# Patient Record
Sex: Male | Born: 1960 | Race: White | Hispanic: No | Marital: Married | Smoking: Current every day smoker
Health system: Southern US, Community
[De-identification: ages and names within clinical notes are randomized; demographics above are authoritative.]

## PROBLEM LIST (undated history)

## (undated) DIAGNOSIS — K219 Gastro-esophageal reflux disease without esophagitis: Secondary | ICD-10-CM

## (undated) DIAGNOSIS — K760 Fatty (change of) liver, not elsewhere classified: Secondary | ICD-10-CM

## (undated) DIAGNOSIS — F329 Major depressive disorder, single episode, unspecified: Secondary | ICD-10-CM

## (undated) DIAGNOSIS — F32A Depression, unspecified: Secondary | ICD-10-CM

## (undated) DIAGNOSIS — T7840XA Allergy, unspecified, initial encounter: Secondary | ICD-10-CM

## (undated) HISTORY — DX: Allergy, unspecified, initial encounter: T78.40XA

## (undated) HISTORY — PX: VASECTOMY: SHX75

## (undated) HISTORY — PX: COLONOSCOPY: SHX174

---

## 2008-11-15 ENCOUNTER — Encounter: Payer: Self-pay | Admitting: Family Medicine

## 2009-10-02 ENCOUNTER — Ambulatory Visit: Payer: Self-pay | Admitting: Family Medicine

## 2009-10-02 DIAGNOSIS — F329 Major depressive disorder, single episode, unspecified: Secondary | ICD-10-CM

## 2009-10-02 DIAGNOSIS — R5381 Other malaise: Secondary | ICD-10-CM

## 2009-10-02 DIAGNOSIS — R5383 Other fatigue: Secondary | ICD-10-CM

## 2009-10-03 ENCOUNTER — Encounter: Payer: Self-pay | Admitting: Family Medicine

## 2009-10-03 LAB — CONVERTED CEMR LAB
AST: 28 units/L (ref 0–37)
Albumin: 4.8 g/dL (ref 3.5–5.2)
BUN: 12 mg/dL (ref 6–23)
CO2: 27 meq/L (ref 19–32)
Calcium: 10.3 mg/dL (ref 8.4–10.5)
Chloride: 101 meq/L (ref 96–112)
Cholesterol: 161 mg/dL (ref 0–200)
HDL: 46 mg/dL (ref >39)
PSA: 1.84 ng/mL (ref 0.10–4.00)
Potassium: 3.8 meq/L (ref 3.5–5.3)
TSH: 1.375 microintl units/mL (ref 0.350–4.500)
Testosterone Free: 60.8 pg/mL (ref 47.0–244.0)

## 2009-11-04 ENCOUNTER — Ambulatory Visit: Payer: Self-pay | Admitting: Family Medicine

## 2010-01-08 ENCOUNTER — Ambulatory Visit: Payer: Self-pay | Admitting: Family Medicine

## 2010-01-08 DIAGNOSIS — T148XXA Other injury of unspecified body region, initial encounter: Secondary | ICD-10-CM

## 2010-01-31 ENCOUNTER — Ambulatory Visit: Payer: Self-pay | Admitting: Family Medicine

## 2010-01-31 DIAGNOSIS — J019 Acute sinusitis, unspecified: Secondary | ICD-10-CM

## 2010-04-22 NOTE — Letter (Signed)
Summary: Health Screening/Blueprint for Wellness  Health Screening/Blueprint for Wellness   Imported By: Lanelle Bal 10/14/2009 08:47:26  _____________________________________________________________________  External Attachment:    Type:   Image     Comment:   External Document

## 2010-04-22 NOTE — Assessment & Plan Note (Signed)
Summary: Sinusitis   Vital Signs:  Patient profile:   50 year old male Height:      74.5 inches Weight:      185 pounds Temp:     98.7 degrees F oral Pulse rate:   62 / minute BP sitting:   143 / 86  (right arm) Cuff size:   large  Vitals Entered By: Avon Gully CMA, Duncan Dull) (January 31, 2010 3:45 PM) CC: sinus pressure, woke up this morning to sharp stabbing pains in left side of face, teeth hurt   Primary Care Provider:  Seymour Bars DO  CC:  sinus pressure, woke up this morning to sharp stabbing pains in left side of face, and teeth hurt.  History of Present Illness: sinus pressure, woke up this morning to sharp stabbing pains in left side of face, teeth hurt. Sxs really started about a week ago, got some better.  Taking Sudafed - helps some.  Pian on the left side.  No fever.  No ear pain.  Teeth hurt on the left and below the eye over the cheek.  Taking BC powder. No sinus surgery. No recent ABX.    Current Medications (verified): 1)  Zolpidem Tartrate 10 Mg Tabs (Zolpidem Tartrate) .Marland Kitchen.. 1 Tab By Mouth At Bedtime As Needed Sleep  Allergies (verified): No Known Drug Allergies  Comments:  Nurse/Medical Assistant: The patient's medications and allergies were reviewed with the patient and were updated in the Medication and Allergy Lists. Avon Gully CMA, Duncan Dull) (January 31, 2010 3:46 PM)  Physical Exam  General:  Well-developed,well-nourished,in no acute distress; alert,appropriate and cooperative throughout examination Head:  Normocephalic and atraumatic without obvious abnormalities. No apparent alopecia or balding. Eyes:  No corneal or conjunctival inflammation noted. EOMI. Perrla.  Ears:  External ear exam shows no significant lesions or deformities.  Otoscopic examination reveals clear canals, tympanic membranes are intact bilaterally without bulging, retraction, inflammation or discharge. Hearing is grossly normal bilaterally. Nose:  External nasal  examination shows no deformity or inflammation. Nasal mucosa are pink and moist without lesions or exudates. Mouth:  Oral mucosa and oropharynx without lesions or exudates.  Teeth in good repair. Neck:  No deformities, masses, or tenderness noted. Lungs:  Normal respiratory effort, chest expands symmetrically. Lungs are clear to auscultation, no crackles or wheezes. Heart:  Normal rate and regular rhythm. S1 and S2 normal without gallop, murmur, click, rub or other extra sounds. Skin:  no rashes.   Cervical Nodes:  No lymphadenopathy noted Psych:  Cognition and judgment appear intact. Alert and cooperative with normal attention span and concentration. No apparent delusions, illusions, hallucinations   Impression & Recommendations:  Problem # 1:  SINUSITIS - ACUTE-NOS (ICD-461.9)  His updated medication list for this problem includes:    Amoxicillin 875 Mg Tabs (Amoxicillin) .Marland Kitchen... Take 1 tablet by mouth two times a day for 10 days  Instructed on treatment. Call if symptoms persist or worsen. also gave him a sample of nasal steroid to use.  He had been using Afrin and asked him not to do this for more than 3 days at a time.  Sample of Nasonex given.  Instructed on how to use appropriately.  Call if not better in 8 to 10 days.  Complete Medication List: 1)  Zolpidem Tartrate 10 Mg Tabs (Zolpidem tartrate) .Marland Kitchen.. 1 tab by mouth at bedtime as needed sleep 2)  Amoxicillin 875 Mg Tabs (Amoxicillin) .... Take 1 tablet by mouth two times a day for 10 days  Patient  Instructions: 1)  Nasal saline rinses.   2)  Can use the nasal spray 2 sprays  in each nostril.  3)  Call if not better in 8-10 days on the antibiotic.  Prescriptions: AMOXICILLIN 875 MG TABS (AMOXICILLIN) Take 1 tablet by mouth two times a day for 10 days  #20 x 0   Entered and Authorized by:   Nani Gasser MD   Signed by:   Nani Gasser MD on 01/31/2010   Method used:   [facility] [phone]*  (retail)       [address]       [place], [place]  [address]       Ph: [phone] or [phone]       Fax: [phone]   RxID:   [id number]    Orders Added: 1)  Est. Patient Level III [36644]

## 2010-04-22 NOTE — Assessment & Plan Note (Signed)
Summary: CPE   Vital Signs:  Patient profile:   50 year old male Height:      74.5 inches Weight:      183 pounds BMI:     23.27 O2 Sat:      100 % on Room air Pulse rate:   91 / minute BP sitting:   140 / 79  (left arm) Cuff size:   large  Vitals Entered By: Payton Spark CMA (November 04, 2009 3:25 PM)  O2 Flow:  Room air CC: CPE   Primary Care Provider:  Seymour Bars DO  CC:  CPE.  History of Present Illness: Garrett Wilkinson presents for CPE.  He is doing well.  He had normal labs in July.  he stopped Zoloft after 3 days due to 'bad thoughts'.   He found out that his wife was cheating on him.  He has a good support system.  He is using Ambien at night to help him sleep.  His Tetanus vaccine is due.  He will be due for prostate cancer and colorectal cancer screening next year when he turns 50 along with a screening CXR for tobacco use.  He is eating healthy, working out more, taking a MVI daily and denies CP, DOE or leg swelling.    Current Medications (verified): 1)  Zolpidem Tartrate 10 Mg Tabs (Zolpidem Tartrate) .Marland Kitchen.. 1 Tab By Mouth At Bedtime As Needed Sleep  Allergies (verified): No Known Drug Allergies  Past History:  Past Medical History: Reviewed history from 10/02/2009 and no changes required. smoker  Past Surgical History: Reviewed history from 10/02/2009 and no changes required. vasectomy  Family History: Reviewed history from 10/02/2009 and no changes required. mother healthy father healthy brother and sister healthy  Social History: Reviewed history from 10/02/2009 and no changes required. 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  Has 2 grown daughters - 1 in [place] one in [place].  Has 1 grandson.  Has a step daughter, 28. Smokes 1/2 ppd x 30 yrs. 12 ETOH/ wk. Works out 4 x a wk.  Review of Systems  The patient denies anorexia, fever, weight loss, weight gain, vision loss, decreased hearing, hoarseness, chest pain, syncope,  dyspnea on exertion, peripheral edema, prolonged cough, headaches, hemoptysis, abdominal pain, melena, hematochezia, severe indigestion/heartburn, hematuria, incontinence, genital sores, muscle weakness, suspicious skin lesions, transient blindness, difficulty walking, depression, unusual weight change, abnormal bleeding, enlarged lymph nodes, angioedema, breast masses, and testicular masses.    Physical Exam  General:  alert, well-developed, well-nourished, and well-hydrated.   Head:  normocephalic and male-pattern balding.  healing laceration on the top of his head Eyes:  pupils equal, pupils round, and pupils reactive to light.   Ears:  EACs patent; TMs translucent and gray with good cone of light and bony landmarks.  Nose:  no nasal discharge.   Mouth:  good dentition and pharynx pink and moist.   Neck:  no masses.   Lungs:  Normal respiratory effort, chest expands symmetrically. Lungs are clear to auscultation, no crackles or wheezes. Heart:  Normal rate and regular rhythm. S1 and S2 normal without gallop, murmur, click, rub or other extra sounds. Abdomen:  Bowel sounds positive,abdomen soft and non-tender without masses, organomegaly, no AA bruits ausculated Pulses:  2+ radial and pedal pulses Extremities:  no LE edema  Skin:  hematoma L great toe just proximal to the nail bed Cervical Nodes:  No lymphadenopathy noted Psych:  good eye contact, not anxious appearing, and  not depressed appearing.     Impression & Recommendations:  Problem # 1:  HEALTH MAINTENANCE EXAM (ICD-V70.0) Keeping healthy checklist for men reviewed. BP at the ULN. BMI normal range.   Continue healthy diet, regular exercise, MVI. Work on smoking cessation. Call if wanting to add counseling referral but mood has improved/ stabilized w/o meds. Labs 09-2009 normal. Tdap today. RTC in 1 yr for next physical with CXR, EKG, DRE, colonoscopy.  Complete Medication List: 1)  Zolpidem Tartrate 10 Mg Tabs (Zolpidem  tartrate) .Marland Kitchen.. 1 tab by mouth at bedtime as needed sleep  Other Orders: Tdap => 31yrs IM (09811) Admin 1st Vaccine (91478) Admin 1st Vaccine [facility]) [phone])  Patient Instructions: 1)  Let me know if you want to add counseling for your mood. 2)  Labs looked great in July. 3)  Work on cutting back on smoking. 4)  Return for next PHYSICAL in 1 yr.   5)  Tdap today.   Tetanus/Td Vaccine    Vaccine Type: Tdap    Site: right deltoid    Dose: 0.5 ml    Route: IM    Given by: Payton Spark CMA    Exp. Date: 06/15/2011    Lot #: [id number]    VIS given: 02/08/07 version given November 04, 2009.

## 2010-04-22 NOTE — Assessment & Plan Note (Signed)
Summary: NOV depression   Vital Signs:  Patient profile:   50 year old male Height:      74.5 inches Weight:      179 pounds BMI:     22.76 O2 Sat:      97 % on Room air Pulse rate:   82 / minute BP sitting:   136 / 83  (left arm) Cuff size:   large  Vitals Entered By: Payton Spark CMA (October 02, 2009 3:43 PM)  O2 Flow:  Room air CC: New to est. CPE   Primary Care Provider:  Seymour Bars DO  CC:  New to est. CPE.  History of Present Illness: 50 yo WM presents for NOV.  He is previously healthy other than borderline HTN and borderline high chol.  He takes no meds.  Took Prozac while in the [employer] but it did not work.  He is going thru a separation from his wife and has had poor contenctraion, fatigue, depression and poor sleep.  he is drinking about 12 ETOH/ wk.  he has tried OTC pills for sleep but nothing is helping.  Denies any suicidal thoughts.  Denies any wt loss.  Has been eating healthy and exercising regularly.    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  Has 2 grown daughters - 1 in [place] and one in [place].  Has 1 grandson.  Has a step daughter, 89. Smokes 1/2 ppd x 30 yrs. 12 ETOH/ wk. Works out 4 x a wk.  Review of Systems       no fevers/sweats/weakness, unexplained wt loss/gain, no change in vision, no difficulty hearing, ringing in ears, no hay fever/+allergies, no CP/discomfort, no palpitations, no breast lump/nipple discharge, no cough/wheeze, no blood in stool,no  N/V/D, no nocturia, no leaking urine, no unusual vag bleeding, no vaginal/penile discharge, + muscle/joint pain, no rash, no new/changing mole, no HA, no memory loss, + anxiety, + sleep problem, + depression, no  unexplained lumps, no easy bruising/bleeding, no concern with sexual function   Physical Exam  General:  alert, well-developed, well-nourished, and well-hydrated.   Head:  normocephalic, atraumatic, and male-pattern balding.   Eyes:  sclera non icteric Mouth:  good dentition and pharynx pink and moist.   Neck:  no masses.   Lungs:  Normal respiratory effort, chest expands symmetrically. Lungs are clear to auscultation, no crackles or wheezes. Heart:  Normal rate and regular rhythm. S1 and S2 normal without gallop, murmur, click, rub or other extra sounds. Psych:  good eye contact, not anxious appearing, and flat affect.     Impression & Recommendations:  Problem # 1:  DEPRESSION, RECURRENT (ICD-311) Related to recent marrital separation.  PHQ 9 score of 13 c/w moderate depression.  Explained that untreated depression will cause all of his symptoms - fatigue, poor sleep, poor focus and concentration.    Agrees to trial of Sertraline 50 mg/ day.  Start with 1/2 tab daily.   Treat poor sleep with Zolpidem 10 mg at bedtime.  Call if any problems on the new medication.  RTC for a CPE in 1 month and will check labs to r/o organic causes of depression and fatigue.   His updated medication list for this problem includes:  Sertraline Hcl 50 Mg Tabs (Sertraline hcl) .Marland Kitchen... 1 tab by mouth qam  Complete Medication List: 1)  Sertraline Hcl 50 Mg Tabs (Sertraline hcl) .Marland Kitchen.. 1 tab by mouth qam 2)  Zolpidem Tartrate 10 Mg Tabs (Zolpidem tartrate) .Marland Kitchen.. 1 tab by mouth at bedtime as needed sleep  Other Orders: T-Comprehensive Metabolic Panel [id number]) T-Lipid Profile [id number]) T-TSH [phone]) T-PSA [id number]) T-Testosterone, Free and Total [phone])  Patient Instructions: 1)  Start on Sertraline daily.  CUT IN HALF FOR THE FIRST WEEK. 2)  Call if any worsening in mood. 3)  Use ZOLPIDEM as needed for sleep.  Allow 8 hrs to sleep after taking. 4)  Update labs today. 5)   Will call you w/ results tomorrow. 6)  Return in 1 month for a PHYSICAL. Prescriptions: ZOLPIDEM TARTRATE 10 MG TABS (ZOLPIDEM TARTRATE) 1 tab by mouth at bedtime as needed sleep  #30 x 1   Entered and Authorized by:   Seymour Bars DO   Signed by:   Seymour Bars DO on 10/02/2009   Method used:   Printed then faxed [facility] ...       [facility] [address] [phone]* (retail)       [address] [place], [place]  [address]       Ph: [phone] or [phone]       Fax: [phone]   RxID:   [id number] SERTRALINE HCL 50 MG TABS (SERTRALINE HCL) 1 tab by mouth qAM  #30 x 1   Entered and Authorized by:   Seymour Bars DO   Signed by:   Seymour Bars DO on 10/02/2009   Method used:   [facility]        [facility] [phone]* (retail)       [address]       [place], [place]  [address]       Ph: [phone] or [phone]       Fax: [phone]   RxID:   [id number]

## 2010-04-22 NOTE — Assessment & Plan Note (Signed)
Summary: serratus anterior injury   Vital Signs:  Patient profile:   50 year old male Height:      74.5 inches Weight:      185 pounds BMI:     23.52 O2 Sat:      97 % on Room air Pulse rate:   98 / minute BP sitting:   142 / 88  (left arm) Cuff size:   large  Vitals Entered By: Payton Spark CMA (January 08, 2010 3:00 PM)  O2 Flow:  Room air CC: Injured L shoulder while lifting weights x 2 weeks ago.    Primary Care Voncile Schwarz:  Seymour Bars DO  CC:  Injured L shoulder while lifting weights x 2 weeks ago. Marland Kitchen  History of Present Illness: 50 yo WM presents for L shoulder pain that started 2 wks ago while lifting kettlebells.  He started to feel better but did it again 2 days ago.  He's had pain during the day and at night.  Has pain when he rotates to the R side.  Has full ROM of his L shoulder.  has tingling down the L arm.  He has tried Aleve and Motrin but nothing is helping.    He had an old injury in the anterior L shoulder 5 yrs ago.  He is having some tingling into the L hand x 2 days.  Denies weakness.    Current Medications (verified): 1)  Zolpidem Tartrate 10 Mg Tabs (Zolpidem Tartrate) .Marland Kitchen.. 1 Tab By Mouth At Bedtime As Needed Sleep  Allergies (verified): No Known Drug Allergies  Past History:  Past Medical History: Reviewed history from 10/02/2009 and no changes required. smoker  Social History: Reviewed history from 10/02/2009 and no changes required. 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  Has 2 grown daughters - 1 in [place] and one in [place].  Has 1 grandson.  Has a step daughter, 4. Smokes 1/2 ppd x 30 yrs. 12 ETOH/ wk. Works out 4 x a wk.  Review of Systems      See HPI  Physical Exam  General:  alert, well-developed, well-nourished, and well-hydrated.   Msk:  tender over the medial, upper L scapular border with a mildly winged scapula while doing a wall push - up.  full glenohumeral ROM with neg hawkins sign, neg empty can test.  full C spine ROM Pulses:  2+ radial pulses Extremities:  no UE or LE edema Neurologic:  grip + 5/5 bilat, full bicepts/ tricepts strength Skin:  color normal.     Impression & Recommendations:  Problem # 1:  MUSCLE STRAIN (ICD-848.9) L serratus anterior strain vs partial tear occured while weight lifting. Will treat with RX NSAIDs, limited Vicodin and Flexeril at night. Avoid weight training overhead x 2 wks.   Call if not improving after 2 wks.  Send to sport med or PT for rehab if needed.  Trigger point injections can be done for this by sports medicine.  Complete Medication List: 1)  Zolpidem Tartrate 10 Mg Tabs (Zolpidem tartrate) .Marland Kitchen.. 1 tab by mouth at bedtime as needed sleep 2)  Cyclobenzaprine Hcl 10 Mg Tabs (Cyclobenzaprine hcl) .Marland Kitchen.. 1 tab by mouth at bedtime for shoulder strain 3)  Etodolac 400 Mg Tabs (Etodolac) .Marland Kitchen.. 1 tab by mouth two times a day with food x 10 days 4)  Hydrocodone-acetaminophen 5-325 Mg Tabs (Hydrocodone-acetaminophen) .Marland Kitchen.. 1-2 tabs by mouth at bedtime with food as needed for severe pain  Patient Instructions: 1)  For  injury to the SERRATUS ANTERIOR muscle: 2)  Treat with generic flexeril at night (muscle relaxer) 3)  Use Etodolac with breakfast and dinner for pain and inflammation (instead of aleve, ibuprofen or motrin). 4)  Use Hydrocodone at night for severe pain. 5)  Avoid overhead lifting exercise. 6)  REad thru home rehab online. 7)  If not improved in 3 wks, please call and we'll set up PT. Prescriptions: HYDROCODONE-ACETAMINOPHEN 5-325 MG TABS (HYDROCODONE-ACETAMINOPHEN) 1-2 tabs by mouth at bedtime with food as needed for severe pain  #10 x 0   Entered and Authorized by:   Seymour Bars DO   Signed by:   Seymour Bars DO on 01/08/2010   Method used:   Printed then faxed to ...       [facility] [address] [phone]* (retail)       [address] [place], [place]  [address]       Ph: [phone] or [phone]       Fax: [phone]   RxID:    [id number] ETODOLAC 400 MG TABS (ETODOLAC) 1 tab by mouth two times a day with food x 10 days  #20 x 0   Entered and Authorized by:   Seymour Bars DO   Signed by:   Seymour Bars DO on 01/08/2010   Method used:   [facility] to        [facility] [phone]* (retail)       [address] [place], [place]  [address]       Ph: [phone] or [phone]       Fax: [phone]   RxID:   [id number] CYCLOBENZAPRINE HCL 10 MG TABS (CYCLOBENZAPRINE HCL) 1 tab by mouth at bedtime for shoulder strain  #20 x 0   Entered and Authorized by:   Seymour Bars DO   Signed by:   Seymour Bars DO on 01/08/2010   Method used:   Electronically to        [facility] [phone]* (retail)       [address]       [place], [place]  [address]       Ph: [phone] or [phone]       Fax: [phone]   RxID:   [id number]    Orders Added: 1)  Est. Patient Level III [[address]]

## 2010-04-22 NOTE — Letter (Signed)
Summary: Depression Questionnaire  Depression Questionnaire   Imported By: Lanelle Bal 10/08/2009 14:52:24  _____________________________________________________________________  External Attachment:    Type:   Image     Comment:   External Document

## 2010-04-22 NOTE — Letter (Signed)
Summary: Out of Work  [facility]  [address]   [place], [place] [address]   Phone: [phone]  Fax: [phone]    November 04, 2009   Employee:  Jacqualyn Posey    To Whom It May Concern:   For Medical reasons, please excuse the above named employee from work for the following dates:  Start:   Aug 14th  End:   Aug 15th  If you need additional information, please feel free to contact our office.         Sincerely,    Seymour Bars DO

## 2010-05-23 ENCOUNTER — Ambulatory Visit (INDEPENDENT_AMBULATORY_CARE_PROVIDER_SITE_OTHER): Payer: BC Managed Care – PPO | Admitting: Family Medicine

## 2010-05-23 ENCOUNTER — Encounter: Payer: Self-pay | Admitting: Family Medicine

## 2010-05-23 DIAGNOSIS — M7500 Adhesive capsulitis of unspecified shoulder: Secondary | ICD-10-CM

## 2010-05-29 NOTE — Assessment & Plan Note (Signed)
Summary: L shoulder injury   Vital Signs:  Patient profile:   50 year old male Height:      74.5 inches Weight:      192 pounds BMI:     24.41 O2 Sat:      98 % on Room air Pulse rate:   88 / minute BP sitting:   135 / 78  (left arm) Cuff size:   large  Vitals Entered By: Payton Spark CMA (May 23, 2010 11:06 AM)  O2 Flow:  Room air CC: L shoulder and back pain. Decreased range of motion   Primary Care Provider:  Seymour Bars DO  CC:  L shoulder and back pain. Decreased range of motion.  History of Present Illness: 50 yo WM presents for frozen shoulder on the L.  He had an injury about 5 yrs ago to the L shoulder and was treated by [facility].  Received a steroid shot at the time and it helped a lot.  He had a serratus anterior injury at his last OV with me and has layed off upper body lifting.    He denies having pain int he neck or shoulder or back but has limited ROM, can barely raise R arm to 90 deg now and has noticed atrophy around the deltoid and posterior shoulder.    Current Medications (verified): 1)  None  Allergies (verified): No Known Drug Allergies  Past History:  Past Medical History: Reviewed history from 10/02/2009 and no changes required. smoker  Past Surgical History: Reviewed history from 10/02/2009 and no changes required. vasectomy  Social History: Reviewed history from 10/02/2009 and no changes required. 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  Has 2 grown daughters - 1 in [place] and one in [place].  Has 1 grandson.  Has a step daughter, 28. Smokes 1/2 ppd x 30 yrs. 12 ETOH/ wk. Works out 4 x a wk.  Review of Systems      See HPI  Physical Exam  General:  alert, well-developed, well-nourished, and well-hydrated.   Neck:  supple and full ROM.   Lungs:  Normal respiratory effort, chest expands symmetrically. Lungs are clear to auscultation, no crackles or wheezes. Heart:  Normal rate and regular rhythm. S1 and S2 normal  without gallop, murmur, click, rub or other extra sounds. Msk:  noticeable atrophy over the posterior L shoulder region with full nontender c spine ROM.  nontender over rhomboids with full bicep/ tricept resisted strength.  active flexion r shoulder limited to 80 deg,  able to int rotate but not fully ext rotate at the glenohumeral joint on the L.  grip + 5/5 bilat.  winging of the L scapula with wall push up. Pulses:  2+ radial pulses bilat Extremities:  no UE edema Neurologic:  sensation intact to light touch.   Skin:  color normal.     Impression & Recommendations:  Problem # 1:  ADHESIVE CAPSULITIS (ICD-726.0) L shoulder adhesive capsulitis with atrophy after having injury to the serratus anterior with what appears to be a winged scapula.  Not having pain.  will get him in with ortho.  may need sports u/s, injection and PT.   Orders: Orthopedic Surgeon Referral (Ortho Surgeon)  Patient Instructions: 1)  Will get you in with [facility] for L frozen shoulder/ winged scapula   Orders Added: 1)  Orthopedic Surgeon Referral [Ortho Surgeon] 2)  Est. Patient Level III [[address]]

## 2010-07-04 ENCOUNTER — Telehealth: Payer: Self-pay | Admitting: Family Medicine

## 2010-07-04 DIAGNOSIS — M67919 Unspecified disorder of synovium and tendon, unspecified shoulder: Secondary | ICD-10-CM

## 2010-07-04 NOTE — Telephone Encounter (Signed)
Patient states that Dr Alvera Novel w/[facility] says he cannot guarantee that he can repair torn rotator cuff, patient would like a referral to Dr Dorena Bodo with [facility] in [place] for a second opinion, call if any problems

## 2010-07-04 NOTE — Telephone Encounter (Signed)
Done

## 2010-07-04 NOTE — Telephone Encounter (Signed)
Addended by: Seymour Bars on: 07/04/2010 12:11 PM   Modules accepted: Orders

## 2011-07-24 ENCOUNTER — Encounter: Payer: Self-pay | Admitting: Family Medicine

## 2011-07-24 ENCOUNTER — Ambulatory Visit (INDEPENDENT_AMBULATORY_CARE_PROVIDER_SITE_OTHER): Payer: BC Managed Care – PPO | Admitting: Family Medicine

## 2011-07-24 VITALS — BP 124/80 | HR 84 | Ht 74.5 in | Wt 198.0 lb

## 2011-07-24 DIAGNOSIS — Z0000 Encounter for general adult medical examination without abnormal findings: Secondary | ICD-10-CM

## 2011-07-24 DIAGNOSIS — Z1211 Encounter for screening for malignant neoplasm of colon: Secondary | ICD-10-CM

## 2011-07-24 NOTE — Progress Notes (Signed)
Subjective:    Patient ID: Garrett Wilkinson, male    DOB: 06/14/1960, 51 y.o.   MRN: [id number]  HPI Here for CPE today. Says has had a rough year.  His dog died, went through a divorce, and now losing his job through [employer].     Review of Systems Comprehensive ROS is negative.   BP 124/80  Pulse 84  Ht 6' 2.5" (1.892 m)  Wt 198 lb (89.812 kg)  BMI 25.08 kg/m2    No Known Allergies  History reviewed. No pertinent past medical history.  Past Surgical History  Procedure Date  . Vasectomy     History   Social History  . Marital Status: Married    Spouse Name: N/A    Number of Children: 2  . Years of Education: N/A   Occupational History  . Molding     [employer]   Social History Main Topics  . Smoking status: Current Everyday Smoker -- 0.5 packs/day for 30 years    Types: Cigarettes  . Smokeless tobacco: Not on file   Comment: He has been trying to wean.    . Alcohol Use: 6.0 oz/week    12 drink(s) per week  . Drug Use: No  . Sexually Active: Not on file   Other Topics Concern  . Not on file   Social History Narrative   Working out about 4 days per week.     Family History  Problem Relation Age of Onset  . Hypertension Father   . Diabetes Maternal Grandmother     Outpatient Encounter Prescriptions as of 07/24/2011  Medication Sig Dispense Refill  . glucosamine-chondroitin 500-400 MG tablet Take 1 tablet by mouth 3 (three) times daily.      . Multiple Vitamin (MULTIVITAMINS PO) Take by mouth.               Objective:   Physical Exam  Constitutional: He is oriented to person, place, and time. He appears well-developed and well-nourished.  HENT:  Head: Normocephalic and atraumatic.  Right Ear: External ear normal.  Left Ear: External ear normal.  Nose: Nose normal.  Mouth/Throat: Oropharynx is clear and moist.  Eyes: Conjunctivae and EOM are normal. Pupils are equal, round, and reactive to light.  Neck: Normal range of motion. Neck supple. No thyromegaly  present.  Cardiovascular: Normal rate, regular rhythm, normal heart sounds and intact distal pulses.   Pulmonary/Chest: Effort normal and breath sounds normal.  Abdominal: Soft. Bowel sounds are normal. He exhibits no distension and no mass. There is no tenderness. There is no rebound and no guarding.  Genitourinary:       Decline prostate exam.   Musculoskeletal: Normal range of motion.  Lymphadenopathy:    He has no cervical adenopathy.  Neurological: He is alert and oriented to person, place, and time. He has normal reflexes.  Skin: Skin is warm and dry.  Psychiatric: He has a normal mood and affect. His behavior is normal. Judgment and thought content normal.          Assessment & Plan:  CPE- exam is normal today. We discussed that he is due for screening colonoscopy. He is okay with making a referral. He is also due for CMP and fasting lipid panel. He will go for that today. Continue with regular exercise he is doing fantastic with this. His blood pressure looks wonderful. We need to  check his cholesterol and make sure we were stratified him for heart disease. No family history of prostate  cancer. We will check a PSA today. He declined rectal exam.

## 2011-07-24 NOTE — Patient Instructions (Signed)
Start a regular exercise program and make sure you are eating a healthy diet Try to eat 4 servings of dairy a day  Your vaccines are up to date.   

## 2011-07-25 LAB — COMPLETE METABOLIC PANEL WITH GFR
AST: 31 U/L (ref 0–37)
Albumin: 4.5 g/dL (ref 3.5–5.2)
Alkaline Phosphatase: 61 U/L (ref 39–117)
Chloride: 103 mEq/L (ref 96–112)
Glucose, Bld: 87 mg/dL (ref 70–99)
Potassium: 4.2 mEq/L (ref 3.5–5.3)
Sodium: 140 mEq/L (ref 135–145)
Total Protein: 7.4 g/dL (ref 6.0–8.3)

## 2011-07-25 LAB — LIPID PANEL
Total CHOL/HDL Ratio: 5.1 Ratio
VLDL: 20 mg/dL (ref 0–40)

## 2011-08-05 ENCOUNTER — Encounter: Payer: Self-pay | Admitting: Gastroenterology

## 2011-08-20 ENCOUNTER — Encounter: Payer: Self-pay | Admitting: Gastroenterology

## 2011-09-09 ENCOUNTER — Encounter: Payer: BC Managed Care – PPO | Admitting: Gastroenterology

## 2011-09-22 ENCOUNTER — Ambulatory Visit (AMBULATORY_SURGERY_CENTER): Payer: BC Managed Care – PPO

## 2011-09-22 VITALS — Ht 74.5 in | Wt 202.4 lb

## 2011-09-22 DIAGNOSIS — Z1211 Encounter for screening for malignant neoplasm of colon: Secondary | ICD-10-CM

## 2011-09-22 MED ORDER — MOVIPREP 100 G PO SOLR: ORAL | Status: DC

## 2011-10-06 ENCOUNTER — Ambulatory Visit (AMBULATORY_SURGERY_CENTER): Payer: BC Managed Care – PPO | Admitting: Gastroenterology

## 2011-10-06 ENCOUNTER — Encounter: Payer: Self-pay | Admitting: Gastroenterology

## 2011-10-06 VITALS — BP 136/69 | HR 71 | Temp 97.5°F | Resp 12 | Ht 74.5 in | Wt 202.0 lb

## 2011-10-06 DIAGNOSIS — Z1211 Encounter for screening for malignant neoplasm of colon: Secondary | ICD-10-CM

## 2011-10-06 DIAGNOSIS — D126 Benign neoplasm of colon, unspecified: Secondary | ICD-10-CM

## 2011-10-06 MED ORDER — SODIUM CHLORIDE 0.9 % IV SOLN: 500.0000 mL | INTRAVENOUS | Status: DC

## 2011-10-06 NOTE — Patient Instructions (Signed)
FOLLOW DISCHARGE INSTRUCTIONS  YOU HAD AN ENDOSCOPIC PROCEDURE TODAY AT THE [facility]: Refer to the procedure report that was given to you for any specific questions about what was found during the examination.  If the procedure report does not answer your questions, please call your gastroenterologist to clarify.  If you requested that your care partner not be given the details of your procedure findings, then the procedure report has been included in a sealed envelope for you to review at your convenience later.  YOU SHOULD EXPECT: Some feelings of bloating in the abdomen. Passage of more gas than usual.  Walking can help get rid of the air that was put into your GI tract during the procedure and reduce the bloating. If you had a lower endoscopy (such as a colonoscopy or flexible sigmoidoscopy) you may notice spotting of blood in your stool or on the toilet paper. If you underwent a bowel prep for your procedure, then you may not have a normal bowel movement for a few days.  DIET: Your first meal following the procedure should be a light meal and then it is ok to progress to your normal diet.  A half-sandwich or bowl of soup is an example of a good first meal.  Heavy or fried foods are harder to digest and may make you feel nauseous or bloated.  Likewise meals heavy in dairy and vegetables can cause extra gas to form and this can also increase the bloating.  Drink plenty of fluids but you should avoid alcoholic beverages for 24 hours.  ACTIVITY: Your care partner should take you home directly after the procedure.  You should plan to take it easy, moving slowly for the rest of the day.  You can resume normal activity the day after the procedure however you should NOT DRIVE or use heavy machinery for 24 hours (because of the sedation medicines used during the test).    SYMPTOMS TO REPORT IMMEDIATELY: A gastroenterologist can be reached at any hour.  During normal business hours, 8:30 AM to  5:00 PM Monday through Friday, call [phone].  After hours and on weekends, please call the GI answering service at [phone] who will take a message and have the physician on call contact you.   Following lower endoscopy (colonoscopy or flexible sigmoidoscopy):  Excessive amounts of blood in the stool  Significant tenderness or worsening of abdominal pains  Swelling of the abdomen that is new, acute  Fever of 100F or higher   FOLLOW UP: If any biopsies were taken you will be contacted by phone or by letter within the next 1-3 weeks.  Call your gastroenterologist if you have not heard about the biopsies in 3 weeks.  Our staff will call the home number listed on your records the next business day following your procedure to check on you and address any questions or concerns that you may have at that time regarding the information given to you following your procedure. This is a courtesy call and so if there is no answer at the home number and we have not heard from you through the emergency physician on call, we will assume that you have returned to your regular daily activities without incident.  SIGNATURES/CONFIDENTIALITY: You and/or your care partner have signed paperwork which will be entered into your electronic medical record.  These signatures attest to the fact that that the information above on your After Visit Summary has been reviewed and is understood.  Full responsibility  of the confidentiality of this discharge information lies with you and/or your care-partner.     INFORMATION ON POLYPS,DIVERTICULOSIS, & HIGH FIBER DIET GIVEN TO YOU TODAY  AWAIT PATHOLOGY RESULTS
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
## 2011-10-06 NOTE — Op Note (Signed)
[facility] [address] [place], [place]  [address]  COLONOSCOPY PROCEDURE REPORT  PATIENT:  Garrett, Wilkinson  MR#:  [id number] BIRTHDATE:  15-May-1960, 51 yrs. old  GENDER:  male ENDOSCOPIST:  Barbette Hair. Arlyce Dice, MD REF. BY:  Nani Gasser, M.D. PROCEDURE DATE:  10/06/2011 PROCEDURE:  Colonoscopy with snare polypectomy ASA CLASS:  Class I INDICATIONS:  Routine Risk Screening MEDICATIONS:   MAC sedation, administered by CRNA propofol 300mg IV  DESCRIPTION OF PROCEDURE:   After the risks benefits and alternatives of the procedure were thoroughly explained, informed consent was obtained.  Digital rectal exam was performed and revealed no abnormalities.   The LB [id number] endoscope was introduced through the anus and advanced to the cecum, which was identified by both the appendix and ileocecal valve, without limitations.  The quality of the prep was excellent, using MoviPrep.  The instrument was then slowly withdrawn as the colon was fully examined. <<PROCEDUREIMAGES>>  FINDINGS:  Two polyps were found (see image7 and image8). 2 2-24mm sessile polyps in rectosigmoid and rectum - removed with cold polypectomy snare  Mild diverticulosis was found in the sigmoid colon (see image5).  This was otherwise a normal examination of the colon (see image2 and image6).   Retroflexed views in the rectum revealed no abnormalities.    The time to cecum =  1) 3.0 minutes. The scope was then withdrawn in  1) 7.25  minutes from the cecum and the procedure completed. COMPLICATIONS:  None ENDOSCOPIC IMPRESSION: 1) Two polyps 2) Mild diverticulosis in the sigmoid colon 3) Otherwise normal examination RECOMMENDATIONS: 1) If the polyp(s) removed today are proven to be adenomatous (pre-cancerous) polyps, you will need a repeat colonoscopy in 5 years. Otherwise you should continue to follow colorectal cancer screening guidelines for "routine risk" patients with colonoscopy in 10  years. You will receive a letter within 1-2 weeks with the results of your biopsy as well as final recommendations. Please call my office if you have not received a letter after 3 weeks. REPEAT EXAM:   You will receive a letter from Dr. Arlyce Dice in 1-2 weeks, after reviewing the final pathology, with followup recommendations.  ______________________________ Barbette Hair Arlyce Dice, MD  CC:  n. eSIGNED:   Barbette Hair. Garrett Wilkinson at 10/06/2011 11:26 AM  Jacqualyn Posey, [phone]

## 2011-10-07 ENCOUNTER — Telehealth: Payer: Self-pay

## 2011-10-07 NOTE — Telephone Encounter (Signed)
Left message on answering machine. 

## 2011-10-14 ENCOUNTER — Encounter: Payer: Self-pay | Admitting: Gastroenterology

## 2013-03-23 HISTORY — PX: ANKLE FRACTURE SURGERY: SHX122

## 2015-03-28 ENCOUNTER — Other Ambulatory Visit (HOSPITAL_COMMUNITY): Payer: Self-pay | Admitting: Orthopedic Surgery

## 2015-03-29 ENCOUNTER — Other Ambulatory Visit (HOSPITAL_COMMUNITY): Payer: Self-pay | Admitting: Orthopedic Surgery

## 2015-04-01 ENCOUNTER — Encounter (HOSPITAL_COMMUNITY): Payer: Self-pay | Admitting: *Deleted

## 2015-04-01 MED ORDER — CHLORHEXIDINE GLUCONATE 4 % EX LIQD: 60.0000 mL | Freq: Once | CUTANEOUS | Status: DC

## 2015-04-01 NOTE — Progress Notes (Signed)
I spoke with patient and informed him of new arrival time of 32.

## 2015-04-01 NOTE — Progress Notes (Addendum)
Pt denies cardiac history, chest pain or sob. Pt is concerned about how he will get his pain prescription filled after surgery since he is a [place] pt. I encouraged him to call the [facility] and check with them. He voiced understanding.  Pt has been taking Sudafed for allergies, last dose was yesterday. Instructed pt not to take any today or prior to surgery tomorrow. He voiced understanding.

## 2015-04-02 ENCOUNTER — Ambulatory Visit (HOSPITAL_COMMUNITY): Payer: No Typology Code available for payment source | Admitting: Certified Registered Nurse Anesthetist

## 2015-04-02 ENCOUNTER — Encounter (HOSPITAL_COMMUNITY): Payer: Self-pay | Admitting: *Deleted

## 2015-04-02 ENCOUNTER — Observation Stay (HOSPITAL_COMMUNITY)
Admission: RE | Admit: 2015-04-02 | Discharge: 2015-04-04 | Disposition: A | Discharge status: Home Health | Payer: No Typology Code available for payment source | Source: Ambulatory Visit | Attending: Orthopedic Surgery | Admitting: Orthopedic Surgery

## 2015-04-02 ENCOUNTER — Encounter (HOSPITAL_COMMUNITY)
Admission: RE | Disposition: A | Discharge status: Home Health | Payer: Self-pay | Source: Ambulatory Visit | Attending: Orthopedic Surgery

## 2015-04-02 ENCOUNTER — Observation Stay (HOSPITAL_COMMUNITY): Payer: No Typology Code available for payment source

## 2015-04-02 DIAGNOSIS — B9689 Other specified bacterial agents as the cause of diseases classified elsewhere: Secondary | ICD-10-CM | POA: Diagnosis not present

## 2015-04-02 DIAGNOSIS — L0889 Other specified local infections of the skin and subcutaneous tissue: Secondary | ICD-10-CM | POA: Diagnosis not present

## 2015-04-02 DIAGNOSIS — T84624D Infection and inflammatory reaction due to internal fixation device of right fibula, subsequent encounter: Secondary | ICD-10-CM | POA: Diagnosis not present

## 2015-04-02 DIAGNOSIS — Y838 Other surgical procedures as the cause of abnormal reaction of the patient, or of later complication, without mention of misadventure at the time of the procedure: Secondary | ICD-10-CM | POA: Insufficient documentation

## 2015-04-02 DIAGNOSIS — M868X6 Other osteomyelitis, lower leg: Secondary | ICD-10-CM | POA: Diagnosis not present

## 2015-04-02 DIAGNOSIS — Y828 Other medical devices associated with adverse incidents: Secondary | ICD-10-CM

## 2015-04-02 DIAGNOSIS — S91001D Unspecified open wound, right ankle, subsequent encounter: Secondary | ICD-10-CM | POA: Diagnosis not present

## 2015-04-02 DIAGNOSIS — L02611 Cutaneous abscess of right foot: Secondary | ICD-10-CM | POA: Diagnosis present

## 2015-04-02 DIAGNOSIS — M869 Osteomyelitis, unspecified: Secondary | ICD-10-CM

## 2015-04-02 DIAGNOSIS — T8469XA Infection and inflammatory reaction due to internal fixation device of other site, initial encounter: Secondary | ICD-10-CM | POA: Diagnosis not present

## 2015-04-02 DIAGNOSIS — F17210 Nicotine dependence, cigarettes, uncomplicated: Secondary | ICD-10-CM | POA: Diagnosis not present

## 2015-04-02 HISTORY — PX: HARDWARE REMOVAL: SHX979

## 2015-04-02 HISTORY — DX: Fatty (change of) liver, not elsewhere classified: K76.0

## 2015-04-02 HISTORY — DX: Depression, unspecified: F32.A

## 2015-04-02 HISTORY — DX: Gastro-esophageal reflux disease without esophagitis: K21.9

## 2015-04-02 HISTORY — DX: Major depressive disorder, single episode, unspecified: F32.9

## 2015-04-02 LAB — CBC
HCT: 42.5 % (ref 39.0–52.0)
Hemoglobin: 14.8 g/dL (ref 13.0–17.0)
MCH: 34.4 pg — ABNORMAL HIGH (ref 26.0–34.0)
MCHC: 34.8 g/dL (ref 30.0–36.0)
MCV: 98.8 fL (ref 78.0–100.0)
Platelets: 257 10*3/uL (ref 150–400)
RBC: 4.3 MIL/uL (ref 4.22–5.81)
RDW: 12.9 % (ref 11.5–15.5)
WBC: 7.6 10*3/uL (ref 4.0–10.5)

## 2015-04-02 LAB — COMPREHENSIVE METABOLIC PANEL
ALK PHOS: 67 U/L (ref 38–126)
ALT: 75 U/L — AB (ref 17–63)
AST: 65 U/L — AB (ref 15–41)
Albumin: 4.1 g/dL (ref 3.5–5.0)
Anion gap: 7 (ref 5–15)
BILIRUBIN TOTAL: 0.4 mg/dL (ref 0.3–1.2)
BUN: 8 mg/dL (ref 6–20)
CALCIUM: 9.2 mg/dL (ref 8.9–10.3)
CO2: 26 mmol/L (ref 22–32)
CREATININE: 0.82 mg/dL (ref 0.61–1.24)
Chloride: 107 mmol/L (ref 101–111)
GFR calc Af Amer: >60 mL/min (ref >60)
Glucose, Bld: 108 mg/dL — ABNORMAL HIGH (ref 65–99)
Potassium: 4.3 mmol/L (ref 3.5–5.1)
Sodium: 140 mmol/L (ref 135–145)
TOTAL PROTEIN: 7.6 g/dL (ref 6.5–8.1)

## 2015-04-02 SURGERY — REMOVAL, HARDWARE
Anesthesia: General | Site: Ankle | Laterality: Right

## 2015-04-02 MED ORDER — MIDAZOLAM HCL 2 MG/2ML IJ SOLN
INTRAMUSCULAR | Status: AC
  Filled 2015-04-02: qty 2

## 2015-04-02 MED ORDER — DEXAMETHASONE SODIUM PHOSPHATE 4 MG/ML IJ SOLN
INTRAMUSCULAR | Status: AC
  Filled 2015-04-02: qty 1

## 2015-04-02 MED ORDER — OXYCODONE HCL 5 MG PO TABS
5.0000 mg | ORAL_TABLET | ORAL | Status: DC | PRN
  Administered 2015-04-02 (×3): 10 mg via ORAL
  Administered 2015-04-02: 5 mg via ORAL
  Administered 2015-04-03 – 2015-04-04 (×7): 10 mg via ORAL
  Filled 2015-04-02 (×10): qty 2

## 2015-04-02 MED ORDER — METOCLOPRAMIDE HCL 5 MG/ML IJ SOLN: 5.0000 mg | Freq: Three times a day (TID) | INTRAMUSCULAR | Status: DC | PRN

## 2015-04-02 MED ORDER — OXYCODONE HCL 5 MG PO TABS
ORAL_TABLET | ORAL | Status: AC
  Filled 2015-04-02: qty 2

## 2015-04-02 MED ORDER — ACETAMINOPHEN 650 MG RE SUPP: 650.0000 mg | Freq: Four times a day (QID) | RECTAL | Status: DC | PRN

## 2015-04-02 MED ORDER — MIDAZOLAM HCL 5 MG/5ML IJ SOLN
INTRAMUSCULAR | Status: DC | PRN
  Administered 2015-04-02: 2 mg via INTRAVENOUS

## 2015-04-02 MED ORDER — EPHEDRINE SULFATE 50 MG/ML IJ SOLN
INTRAMUSCULAR | Status: DC | PRN
  Administered 2015-04-02: 5 mg via INTRAVENOUS
  Administered 2015-04-02: 10 mg via INTRAVENOUS

## 2015-04-02 MED ORDER — ASPIRIN 325 MG PO TABS
325.0000 mg | ORAL_TABLET | Freq: Every day | ORAL | Status: DC
  Administered 2015-04-02 – 2015-04-04 (×3): 325 mg via ORAL
  Filled 2015-04-02 (×3): qty 1

## 2015-04-02 MED ORDER — HYDROMORPHONE HCL 1 MG/ML IJ SOLN
0.2500 mg | INTRAMUSCULAR | Status: DC | PRN
  Administered 2015-04-02 (×2): 0.5 mg via INTRAVENOUS

## 2015-04-02 MED ORDER — LACTATED RINGERS IV SOLN
INTRAVENOUS | Status: DC
  Administered 2015-04-02 (×2): via INTRAVENOUS

## 2015-04-02 MED ORDER — OXYCODONE HCL 5 MG/5ML PO SOLN: 5.0000 mg | Freq: Once | ORAL | Status: AC | PRN

## 2015-04-02 MED ORDER — ACETAMINOPHEN 325 MG PO TABS: 650.0000 mg | ORAL_TABLET | Freq: Four times a day (QID) | ORAL | Status: DC | PRN

## 2015-04-02 MED ORDER — VANCOMYCIN HCL IN DEXTROSE 1-5 GM/200ML-% IV SOLN
1000.0000 mg | Freq: Two times a day (BID) | INTRAVENOUS | Status: DC
  Filled 2015-04-02: qty 200

## 2015-04-02 MED ORDER — PHENYLEPHRINE HCL 10 MG/ML IJ SOLN
INTRAMUSCULAR | Status: DC | PRN
  Administered 2015-04-02: 80 ug via INTRAVENOUS
  Administered 2015-04-02: 40 ug via INTRAVENOUS
  Administered 2015-04-02 (×5): 80 ug via INTRAVENOUS
  Administered 2015-04-02: 40 ug via INTRAVENOUS
  Administered 2015-04-02: 120 ug via INTRAVENOUS
  Administered 2015-04-02: 80 ug via INTRAVENOUS

## 2015-04-02 MED ORDER — VANCOMYCIN HCL 500 MG IV SOLR
INTRAVENOUS | Status: AC
  Filled 2015-04-02: qty 500

## 2015-04-02 MED ORDER — ONDANSETRON HCL 4 MG/2ML IJ SOLN
4.0000 mg | Freq: Four times a day (QID) | INTRAMUSCULAR | Status: DC | PRN
Start: 2015-04-02 — End: 2015-04-04

## 2015-04-02 MED ORDER — PROPOFOL 10 MG/ML IV BOLUS
INTRAVENOUS | Status: AC
  Filled 2015-04-02: qty 20

## 2015-04-02 MED ORDER — KETOROLAC TROMETHAMINE 30 MG/ML IJ SOLN
30.0000 mg | Freq: Once | INTRAMUSCULAR | Status: AC
  Administered 2015-04-02: 30 mg via INTRAVENOUS

## 2015-04-02 MED ORDER — VANCOMYCIN HCL 500 MG IV SOLR
INTRAVENOUS | Status: DC | PRN
  Administered 2015-04-02: 500 mg

## 2015-04-02 MED ORDER — PROMETHAZINE HCL 25 MG/ML IJ SOLN: 6.2500 mg | INTRAMUSCULAR | Status: DC | PRN

## 2015-04-02 MED ORDER — VANCOMYCIN HCL 1000 MG IV SOLR
1000.0000 mg | INTRAVENOUS | Status: DC | PRN
  Administered 2015-04-02: 1000 mg via INTRAVENOUS

## 2015-04-02 MED ORDER — GENTAMICIN SULFATE 40 MG/ML IJ SOLN
INTRAMUSCULAR | Status: AC
  Filled 2015-04-02: qty 4

## 2015-04-02 MED ORDER — ONDANSETRON HCL 4 MG/2ML IJ SOLN
INTRAMUSCULAR | Status: AC
Start: 2015-04-02 — End: 2015-04-02
  Filled 2015-04-02: qty 2

## 2015-04-02 MED ORDER — VANCOMYCIN HCL IN DEXTROSE 1-5 GM/200ML-% IV SOLN
INTRAVENOUS | Status: AC
  Filled 2015-04-02: qty 200

## 2015-04-02 MED ORDER — ONDANSETRON HCL 4 MG/2ML IJ SOLN
INTRAMUSCULAR | Status: AC
  Filled 2015-04-02: qty 2

## 2015-04-02 MED ORDER — 0.9 % SODIUM CHLORIDE (POUR BTL) OPTIME
TOPICAL | Status: DC | PRN
  Administered 2015-04-02: 3000 mL

## 2015-04-02 MED ORDER — GENTAMICIN SULFATE 40 MG/ML IJ SOLN
INTRAMUSCULAR | Status: DC | PRN
  Administered 2015-04-02: 160 mg

## 2015-04-02 MED ORDER — FENTANYL CITRATE (PF) 250 MCG/5ML IJ SOLN
INTRAMUSCULAR | Status: AC
  Filled 2015-04-02: qty 5

## 2015-04-02 MED ORDER — METOCLOPRAMIDE HCL 5 MG PO TABS: 5.0000 mg | ORAL_TABLET | Freq: Three times a day (TID) | ORAL | Status: DC | PRN

## 2015-04-02 MED ORDER — EPHEDRINE SULFATE 50 MG/ML IJ SOLN
INTRAMUSCULAR | Status: AC
  Filled 2015-04-02: qty 1

## 2015-04-02 MED ORDER — PROPOFOL 10 MG/ML IV BOLUS
INTRAVENOUS | Status: DC | PRN
  Administered 2015-04-02: 20 mg via INTRAVENOUS
  Administered 2015-04-02: 200 mg via INTRAVENOUS
  Administered 2015-04-02: 50 mg via INTRAVENOUS

## 2015-04-02 MED ORDER — ONDANSETRON HCL 4 MG PO TABS: 4.0000 mg | ORAL_TABLET | Freq: Four times a day (QID) | ORAL | Status: DC | PRN

## 2015-04-02 MED ORDER — LIDOCAINE HCL (CARDIAC) 20 MG/ML IV SOLN
INTRAVENOUS | Status: DC | PRN
  Administered 2015-04-02 (×2): 20 mg via INTRAVENOUS
  Administered 2015-04-02: 40 mg via INTRAVENOUS

## 2015-04-02 MED ORDER — VANCOMYCIN HCL IN DEXTROSE 1-5 GM/200ML-% IV SOLN
1000.0000 mg | Freq: Three times a day (TID) | INTRAVENOUS | Status: DC
  Administered 2015-04-02 – 2015-04-04 (×6): 1000 mg via INTRAVENOUS
  Filled 2015-04-02 (×7): qty 200

## 2015-04-02 MED ORDER — DEXTROSE 5 % IV SOLN
10.0000 mg | INTRAVENOUS | Status: DC | PRN
  Administered 2015-04-02: 30 ug/min via INTRAVENOUS

## 2015-04-02 MED ORDER — LIDOCAINE HCL (CARDIAC) 20 MG/ML IV SOLN
INTRAVENOUS | Status: AC
  Filled 2015-04-02: qty 5

## 2015-04-02 MED ORDER — HYDROMORPHONE HCL 1 MG/ML IJ SOLN
INTRAMUSCULAR | Status: AC
  Filled 2015-04-02: qty 1

## 2015-04-02 MED ORDER — OXYCODONE HCL 5 MG PO TABS
5.0000 mg | ORAL_TABLET | Freq: Once | ORAL | Status: AC | PRN
  Administered 2015-04-02: 5 mg via ORAL

## 2015-04-02 MED ORDER — KETOROLAC TROMETHAMINE 30 MG/ML IJ SOLN
INTRAMUSCULAR | Status: AC
  Filled 2015-04-02: qty 1

## 2015-04-02 MED ORDER — ONDANSETRON HCL 4 MG/2ML IJ SOLN
INTRAMUSCULAR | Status: DC | PRN
  Administered 2015-04-02: 4 mg via INTRAVENOUS

## 2015-04-02 MED ORDER — FENTANYL CITRATE (PF) 100 MCG/2ML IJ SOLN
INTRAMUSCULAR | Status: DC | PRN
  Administered 2015-04-02 (×2): 25 ug via INTRAVENOUS
  Administered 2015-04-02: 100 ug via INTRAVENOUS
  Administered 2015-04-02 (×3): 50 ug via INTRAVENOUS
  Administered 2015-04-02 (×2): 25 ug via INTRAVENOUS
  Administered 2015-04-02: 50 ug via INTRAVENOUS
  Administered 2015-04-02: 100 ug via INTRAVENOUS

## 2015-04-02 SURGICAL SUPPLY — 61 items
BANDAGE ACE 4X5 VEL STRL LF (GAUZE/BANDAGES/DRESSINGS) ×2 IMPLANT
BANDAGE ACE 6X5 VEL STRL LF (GAUZE/BANDAGES/DRESSINGS) ×2 IMPLANT
BANDAGE ELASTIC 4 VELCRO ST LF (GAUZE/BANDAGES/DRESSINGS) IMPLANT
BANDAGE ELASTIC 6 VELCRO ST LF (GAUZE/BANDAGES/DRESSINGS) IMPLANT
BANDAGE ESMARK 6X9 LF (GAUZE/BANDAGES/DRESSINGS) IMPLANT
BNDG COHESIVE 4X5 TAN STRL (GAUZE/BANDAGES/DRESSINGS) IMPLANT
BNDG ESMARK 4X9 LF (GAUZE/BANDAGES/DRESSINGS) ×2 IMPLANT
BNDG ESMARK 6X9 LF (GAUZE/BANDAGES/DRESSINGS)
BNDG GAUZE ELAST 4 BULKY (GAUZE/BANDAGES/DRESSINGS) ×2 IMPLANT
COVER SURGICAL LIGHT HANDLE (MISCELLANEOUS) ×2 IMPLANT
CUFF TOURNIQUET SINGLE 34IN LL (TOURNIQUET CUFF) ×2 IMPLANT
CUFF TOURNIQUET SINGLE 44IN (TOURNIQUET CUFF) IMPLANT
DRAPE C-ARM 42X72 X-RAY (DRAPES) IMPLANT
DRAPE EXTREMITY T 121X128X90 (DRAPE) IMPLANT
DRAPE INCISE IOBAN 66X45 STRL (DRAPES) IMPLANT
DRAPE OEC MINIVIEW 54X84 (DRAPES) ×2 IMPLANT
DRAPE ORTHO SPLIT 77X108 STRL (DRAPES) ×1
DRAPE PROXIMA HALF (DRAPES) IMPLANT
DRAPE SURG ORHT 6 SPLT 77X108 (DRAPES) ×1 IMPLANT
DRSG EMULSION OIL 3X3 NADH (GAUZE/BANDAGES/DRESSINGS) ×2 IMPLANT
DRSG PAD ABDOMINAL 8X10 ST (GAUZE/BANDAGES/DRESSINGS) ×2 IMPLANT
DRSG VAC ATS SM SENSATRAC (GAUZE/BANDAGES/DRESSINGS) ×2 IMPLANT
ELECT REM PT RETURN 9FT ADLT (ELECTROSURGICAL) ×2
ELECTRODE REM PT RTRN 9FT ADLT (ELECTROSURGICAL) ×1 IMPLANT
GAUZE SPONGE 4X4 12PLY STRL (GAUZE/BANDAGES/DRESSINGS) ×2 IMPLANT
GAUZE XEROFORM 1X8 LF (GAUZE/BANDAGES/DRESSINGS) ×2 IMPLANT
GLOVE BIOGEL PI IND STRL 6.5 (GLOVE) ×2 IMPLANT
GLOVE BIOGEL PI IND STRL 8 (GLOVE) ×1 IMPLANT
GLOVE BIOGEL PI INDICATOR 6.5 (GLOVE) ×2
GLOVE BIOGEL PI INDICATOR 8 (GLOVE) ×1
GLOVE SURG ORTHO 8.0 STRL STRW (GLOVE) ×2 IMPLANT
GLOVE SURG SS PI 7.0 STRL IVOR (GLOVE) ×4 IMPLANT
GOWN STRL REUS W/ TWL LRG LVL3 (GOWN DISPOSABLE) ×3 IMPLANT
GOWN STRL REUS W/ TWL XL LVL3 (GOWN DISPOSABLE) ×2 IMPLANT
GOWN STRL REUS W/TWL LRG LVL3 (GOWN DISPOSABLE) ×3
GOWN STRL REUS W/TWL XL LVL3 (GOWN DISPOSABLE) ×2
GUIDEWIRE ORTHO .094INX9.25IN (WIRE) ×2 IMPLANT
KIT BASIN OR (CUSTOM PROCEDURE TRAY) ×4 IMPLANT
KIT PREVENA INCISION MGT 13 (CANNISTER) ×2 IMPLANT
KIT ROOM TURNOVER OR (KITS) ×2 IMPLANT
KIT STIMULAN RAPID CURE 5CC (Orthopedic Implant) ×2 IMPLANT
MANIFOLD NEPTUNE II (INSTRUMENTS) IMPLANT
MARKER SKIN DUAL TIP RULER LAB (MISCELLANEOUS) ×2 IMPLANT
NS IRRIG 1000ML POUR BTL (IV SOLUTION) ×2 IMPLANT
PACK GENERAL/GYN (CUSTOM PROCEDURE TRAY) ×2 IMPLANT
PAD ARMBOARD 7.5X6 YLW CONV (MISCELLANEOUS) ×4 IMPLANT
PAD CAST 4YDX4 CTTN HI CHSV (CAST SUPPLIES) ×1 IMPLANT
PADDING CAST COTTON 4X4 STRL (CAST SUPPLIES) ×1
PADDING CAST COTTON 6X4 STRL (CAST SUPPLIES) ×2 IMPLANT
STAPLER VISISTAT 35W (STAPLE) ×2 IMPLANT
STOCKINETTE IMPERVIOUS 9X36 MD (GAUZE/BANDAGES/DRESSINGS) ×2 IMPLANT
SUT ETHILON 3 0 PS 1 (SUTURE) ×14 IMPLANT
SUT ETHILON 4 0 FS 1 (SUTURE) ×2 IMPLANT
SUT VIC AB 0 CT1 27 (SUTURE) ×1
SUT VIC AB 0 CT1 27XBRD ANBCTR (SUTURE) ×1 IMPLANT
SUT VIC AB 2-0 CT1 27 (SUTURE) ×1
SUT VIC AB 2-0 CT1 TAPERPNT 27 (SUTURE) ×1 IMPLANT
SUT VIC AB 2-0 UR6 27 (SUTURE) ×2 IMPLANT
TOWEL OR 17X24 6PK STRL BLUE (TOWEL DISPOSABLE) ×2 IMPLANT
TOWEL OR 17X26 10 PK STRL BLUE (TOWEL DISPOSABLE) ×2 IMPLANT
WATER STERILE IRR 1000ML POUR (IV SOLUTION) ×2 IMPLANT
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
## 2015-04-02 NOTE — Anesthesia Procedure Notes (Signed)
Procedure Name: LMA Insertion Date/Time: 04/02/2015 10:56 AM Performed by: Merdis Delay Pre-anesthesia Checklist: Patient identified, Timeout performed, Emergency Drugs available, Patient being monitored and Suction available Patient Re-evaluated:Patient Re-evaluated prior to inductionOxygen Delivery Method: Circle system utilized Preoxygenation: Pre-oxygenation with 100% oxygen Intubation Type: IV induction LMA: LMA inserted LMA Size: 5.0 Number of attempts: 1 Placement Confirmation: positive ETCO2,  CO2 detector and breath sounds checked- equal and bilateral Tube secured with: Tape Dental Injury: Teeth and Oropharynx as per pre-operative assessment

## 2015-04-02 NOTE — Anesthesia Preprocedure Evaluation (Addendum)
Anesthesia Evaluation  Patient identified by MRN, date of birth, ID band Patient awake    Reviewed: Allergy & Precautions, NPO status , Patient's Chart, lab work & pertinent test results  History of Anesthesia Complications Negative for: history of anesthetic complications  Airway Mallampati: I  TM Distance: >3 FB Neck ROM: Full    Dental  (+) Dental Advisory Given   Pulmonary Current Smoker,    breath sounds clear to auscultation       Cardiovascular Exercise Tolerance: Good  Rhythm:Regular Rate:Normal     Neuro/Psych Depression negative neurological ROS     GI/Hepatic GERD  Controlled,"fatty liver"   Endo/Other    Renal/GU negative Renal ROS     Musculoskeletal  (+) Arthritis , Osteoarthritis,    Abdominal (+) - obese,  Abdomen: soft. Bowel sounds: normal.  Peds  Hematology   Anesthesia Other Findings   Reproductive/Obstetrics                           Lab Results  Component Value Date   WBC 7.6 04/02/2015   HGB 14.8 04/02/2015   HCT 42.5 04/02/2015   MCV 98.8 04/02/2015   PLT 257 04/02/2015     Chemistry      Component Value Date/Time   NA 140 04/02/2015 0732   K 4.3 04/02/2015 0732   CL 107 04/02/2015 0732   CO2 26 04/02/2015 0732   BUN 8 04/02/2015 0732   CREATININE 0.82 04/02/2015 0732   CREATININE 0.79 07/24/2011 1122      Component Value Date/Time   CALCIUM 9.2 04/02/2015 0732   ALKPHOS 67 04/02/2015 0732   AST 65* 04/02/2015 0732   ALT 75* 04/02/2015 0732   BILITOT 0.4 04/02/2015 0732      Anesthesia Physical Anesthesia Plan  ASA: II  Anesthesia Plan: General   Post-op Pain Management:    Induction: Intravenous  Airway Management Planned: LMA  Additional Equipment:   Intra-op Plan:   Post-operative Plan: Extubation in OR  Informed Consent: I have reviewed the patients History and Physical, chart, labs and discussed the procedure including the  risks, benefits and alternatives for the proposed anesthesia with the patient or authorized representative who has indicated his/her understanding and acceptance.   Dental advisory given  Plan Discussed with: CRNA  Anesthesia Plan Comments: (Will hold preop ABX per surgeon request. )       Anesthesia Quick Evaluation
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
## 2015-04-02 NOTE — Transfer of Care (Signed)
Immediate Anesthesia Transfer of Care Note  Patient: Garrett Wilkinson  Procedure(s) Performed: Procedure(s): Right Ankle HARDWARE REMOVAL, PLACEMENT OF ANTIBIOTIC BEADS, PROVENA WOULD VAC   (Right)  Patient Location: PACU  Anesthesia Type:General  Level of Consciousness: awake, alert  and oriented  Airway & Oxygen Therapy: Patient Spontanous Breathing and Patient connected to nasal cannula oxygen  Post-op Assessment: Report given to RN, Post -op Vital signs reviewed and stable and Patient moving all extremities  Post vital signs: Reviewed and stable  Last Vitals:  Filed Vitals:   04/02/15 0804 04/02/15 1239  BP: 140/81 138/89  Pulse: 91 109  Temp: 37.5 C 36.5 C  Resp: 18 10    Complications: No apparent anesthesia complications

## 2015-04-02 NOTE — Op Note (Signed)
04/02/2015  12:39 PM  PATIENT:  Garrett Wilkinson  55 y.o. male  PRE-OPERATIVE DIAGNOSIS:  RIGHT ANKLE INFECTION  POST-OPERATIVE DIAGNOSIS:  right ankle infection  PROCEDURE:  Procedure(s): Right Ankle HARDWARE REMOVAL, PLACEMENT OF ANTIBIOTIC BEADS, PROVENA WOULD VAC    SURGEON:  Surgeon(s): Meredith Pel, MD  ASSISTANT: Laure Kidney rnfa  ANESTHESIA:   general  EBL: 25 ml    Total I/O In: 1600 [I.V.:1600] Out: 40 [Blood:40]  BLOOD ADMINISTERED: none  DRAINS: provena wound vac   LOCAL MEDICATIONS USED:  none  SPECIMEN:  Cultures x 3  COUNTS:  YES  TOURNIQUET:    DICTATION: .Other Dictation: Dictation Number [id number]  PLAN OF CARE: Admit for overnight observation  PATIENT DISPOSITION:  PACU - hemodynamically stable

## 2015-04-02 NOTE — H&P (Signed)
Garrett Wilkinson is an 55 y.o. male.   Chief Complaint: Right ankle pain* HPI: Garrett Wilkinson is a 55 year old patient with right ankle pain. He sustained an ankle fracture proximally urine half ago. This is a bimalleolar fracture which is treated with open reduction internal fixation in [place]. He states that he has had some drainage from the incision since the time of the surgery. He has had multiple courses of antibiotics. He was seen at the [facility] and CT scan there demonstrated hardware loosening without definite osteomyelitis. He is currently most recently had clindamycin which was stopped last saw him in the clinic last Wednesday. He denies any fevers or chills. Denies any other joint pains. He is able to ambulate on the ankle. 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 Allergy   . Depression     "years ago"  . GERD (gastroesophageal reflux disease)     rare  . Fatty liver     Past Surgical History  Procedure Laterality Date  . Vasectomy    . Ankle fracture surgery Right 2015  . Colonoscopy      Family History  Problem Relation Age of Onset  . Hypertension Father   . Diabetes Maternal Grandmother    Social History:  reports that he has been smoking Cigarettes.  He has a 15 pack-year smoking history. He has never used smokeless tobacco. He reports that he drinks about 6.0 oz of alcohol per week. He reports that he does not use illicit drugs.  Allergies: No Known Allergies  Medications Prior to Admission  Medication Sig Dispense Refill  . clindamycin (CLEOCIN) 150 MG capsule Take 300 mg by mouth 3 (three) times daily.    Marland Kitchen glucosamine-chondroitin 500-400 MG tablet Take 1 tablet by mouth 2 (two) times daily.     Marland Kitchen ibuprofen (ADVIL,MOTRIN) 400 MG tablet Take 400 mg by mouth every 6 (six) hours as needed for mild pain.    . Multiple Vitamin (MULTIVITAMINS PO) Take 1 tablet by mouth daily.     .  pseudoephedrine (SUDAFED) 30 MG tablet Take 30 mg by mouth every 4 (four) hours as needed for congestion.      No results found for this or any previous visit (from the past 48 hour(s)). No results found.  Review of Systems  Constitutional: Negative.   HENT: Negative.   Eyes: Negative.   Respiratory: Negative.   Cardiovascular: Negative.   Gastrointestinal: Negative.   Genitourinary: Negative.   Musculoskeletal: Positive for joint pain.  Skin: Negative.   Neurological: Negative.   Endo/Heme/Allergies: Negative.   Psychiatric/Behavioral: Negative.     There were no vitals taken for this visit. Physical Exam  Constitutional: He appears well-developed.  HENT:  Head: Normocephalic.  Eyes: Pupils are equal, round, and reactive to light.  Neck: Normal range of motion.  Cardiovascular: Normal rate.   Respiratory: Effort normal.  Neurological: He is alert.  Skin: Skin is warm.  Psychiatric: He has a normal mood and affect.   examination of the right ankle demonstrates well-healed medial incision ankle range of motion itself is fairly reasonable with about 15 of dorsiflexion and 20 of plantar flexion he does have a lateral incision which has some chronic changes of venous stasis surrounding the incision with a rim of about 2 cm. 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 This area  is nontender and there is no active fluctuance or abscess present. Compartments are soft. There is no erythema which is moving proximally.  Assessment/Plan Impression is chronic ankle fracture infection which will require hardware removal and debridement. I took the patient off of his prescribed oral antibiotics last Wednesday. Plan is to hold current IV antibiotics preoperatively until cultures are obtained. We will admit him for 23 hour observation and obtain ID consult. He will also likely have to have a PICC line placed. He may also require subsequent MRI scanning  to rule out occult infection. Bone debridement will also be required. The fracture itself looks healed on the lateral side. He does have some early degenerative changes in the tibiotalar joint but this joint itself does not look infected clinically with absence of synovitis and absence of ankle effusion. Patient extends risk and benefits of surgery. Also plan to use a provena VAC for 1 week postop.  DEAN,GREGORY SCOTT 04/02/2015, 7:22 AM

## 2015-04-02 NOTE — Progress Notes (Signed)
Spoke with patient, his PCP at [facility] is Dr. Rupert Stacks, [place] social worker is Dani Gobble [phone] ext [phone]. Left voicemail for Ms Mock requesting call back regarding auth for home care. Called Ms Mock again, received voicemail. Will continue to follow up with the [place].

## 2015-04-03 ENCOUNTER — Encounter (HOSPITAL_COMMUNITY): Payer: Self-pay | Admitting: Orthopedic Surgery

## 2015-04-03 DIAGNOSIS — T84624D Infection and inflammatory reaction due to internal fixation device of right fibula, subsequent encounter: Secondary | ICD-10-CM | POA: Diagnosis not present

## 2015-04-03 DIAGNOSIS — B9689 Other specified bacterial agents as the cause of diseases classified elsewhere: Secondary | ICD-10-CM | POA: Diagnosis not present

## 2015-04-03 DIAGNOSIS — L0889 Other specified local infections of the skin and subcutaneous tissue: Secondary | ICD-10-CM

## 2015-04-03 DIAGNOSIS — S91001D Unspecified open wound, right ankle, subsequent encounter: Secondary | ICD-10-CM | POA: Diagnosis not present

## 2015-04-03 DIAGNOSIS — T8469XA Infection and inflammatory reaction due to internal fixation device of other site, initial encounter: Secondary | ICD-10-CM | POA: Diagnosis not present

## 2015-04-03 MED ORDER — CIPROFLOXACIN HCL 500 MG PO TABS
750.0000 mg | ORAL_TABLET | Freq: Two times a day (BID) | ORAL | Status: DC
  Administered 2015-04-03 – 2015-04-04 (×2): 750 mg via ORAL
  Filled 2015-04-03 (×2): qty 2

## 2015-04-03 MED ORDER — ASPIRIN 325 MG PO TABS: 325.0000 mg | ORAL_TABLET | Freq: Every day | ORAL | Status: DC

## 2015-04-03 MED ORDER — OXYCODONE HCL 5 MG PO TABS: 5.0000 mg | ORAL_TABLET | ORAL | Status: DC | PRN

## 2015-04-03 MED ORDER — SODIUM CHLORIDE 0.9 % IJ SOLN
10.0000 mL | INTRAMUSCULAR | Status: DC | PRN
  Administered 2015-04-04: 10 mL
  Filled 2015-04-03: qty 40

## 2015-04-03 NOTE — Progress Notes (Addendum)
Received return call from [place] Dani Gobble, she stated that once orders are received I need to fax them to ID clinic at [phone] along with H and P, PICC notes, op note, labs and ID notes. Once orders are received will page Mechele Claude at [phone]. Updated patient that I am awaiting orders and have contacted [place] social worker.    Notified by patient's RN that patient may not receive PICC until late this evening or in am. Contacted Ms Mock at [place], she stated that they will not review patient's information until the PICC note is present, they have to have all needed information prior to review. She stated that patient can work with [place] for home IV. Contacted Adam at [facility], faxed requested information and received confirmation. Will fax IV vancomycin order once order placed and they will be able to service patient once they receive the vancomycin order and approval from the [place]. Explained [facility] process to patient and his wife. Will continue follow up in am.

## 2015-04-03 NOTE — Progress Notes (Signed)
Orthopedic Tech Progress Note Patient Details:  Garrett Wilkinson 1960-03-25 [id number]  Ortho Devices Ortho Device/Splint Location: rle Ortho Device/Splint Interventions: Application Cam Hulan Amato, Taeshawn Helfman 04/03/2015, 4:28 PM

## 2015-04-03 NOTE — Progress Notes (Signed)
Subjective: Pt stable - pain ok - thx to ID for input   Objective: Vital signs in last 24 hours: Temp:  [97.7 F (36.5 C)-99.5 F (37.5 C)] 98.5 F (36.9 C) (01/11 0131) Pulse Rate:  [85-109] 92 (01/11 0131) Resp:  [10-19] 16 (01/11 0131) BP: (110-147)/(75-95) 127/95 mmHg (01/11 0131) SpO2:  [93 %-100 %] 93 % (01/11 0131) Weight:  [97.07 kg (214 lb)] 97.07 kg (214 lb) (01/10 0804)  Intake/Output from previous day: 01/10 0701 - 01/11 0700 In: 1600 [I.V.:1600] Out: 40 [Blood:40] Intake/Output this shift:    Exam:  Intact pulses distally Dorsiflexion/Plantar flexion intact  Labs:  Recent Labs  04/02/15 0732  HGB 14.8    Recent Labs  04/02/15 0732  WBC 7.6  RBC 4.30  HCT 42.5  PLT 257    Recent Labs  04/02/15 0732  NA 140  K 4.3  CL 107  CO2 26  BUN 8  CREATININE 0.82  GLUCOSE 108*  CALCIUM 9.2   No results for input(s): LABPT, INR in the last 72 hours.  Assessment/Plan: Plan dc today after ID disposition - asa and oxy for dc plus abx per ID   DEAN,GREGORY SCOTT 04/03/2015, 7:37 AM

## 2015-04-03 NOTE — Anesthesia Postprocedure Evaluation (Signed)
Anesthesia Post Note  Patient: Garrett Wilkinson  Procedure(s) Performed: Procedure(s) (LRB): Right Ankle HARDWARE REMOVAL, PLACEMENT OF ANTIBIOTIC BEADS, PROVENA WOULD VAC   (Right)  Patient location during evaluation: PACU Anesthesia Type: General Level of consciousness: awake and alert and patient cooperative Pain management: pain level controlled Vital Signs Assessment: post-procedure vital signs reviewed and stable Respiratory status: spontaneous breathing and respiratory function stable Cardiovascular status: stable Anesthetic complications: no    Last Vitals:  Filed Vitals:   04/02/15 2026 04/03/15 0131  BP: 147/87 127/95  Pulse: 90 92  Temp: 37 C 36.9 C  Resp: 16 16    Last Pain:  Filed Vitals:   04/03/15 1220  PainSc: [place]

## 2015-04-03 NOTE — Progress Notes (Signed)
Patient ID: Garrett Wilkinson, male   DOB: [birth year], 55 y.o.   MRN: [id number]         [facility]    Date of Admission:  04/02/2015   Total days of antibiotics 2          Principal Problem:   Infection of bone of right ankle ([place])   . aspirin  325 mg Oral Daily  . vancomycin  1,000 mg Intravenous Q8H    SUBJECTIVE: He is feeling better and eager to go home.  Review of Systems: Review of Systems  Constitutional: Negative for fever, chills and diaphoresis.  Musculoskeletal: Positive for joint pain.    Past Medical History  Diagnosis Date  . Allergy   . Depression     "years ago"  . GERD (gastroesophageal reflux disease)     rare  . Fatty liver     Social History  Substance Use Topics  . Smoking status: Current Every Day Smoker -- 0.50 packs/day for 30 years    Types: Cigarettes  . Smokeless tobacco: Never Used     Comment: He has been trying to wean.    . Alcohol Use: 6.0 oz/week    12 Standard drinks or equivalent per week     Comment: social    Family History  Problem Relation Age of Onset  . Hypertension Father   . Diabetes Maternal Grandmother    No Known Allergies  OBJECTIVE: Filed Vitals:   04/02/15 1400 04/02/15 1500 04/02/15 2026 04/03/15 0131  BP: 127/81 110/75 147/87 127/95  Pulse: 94 86 90 92  Temp: 97.9 F (36.6 C) 98.5 F (36.9 C) 98.6 F (37 C) 98.5 F (36.9 C)  TempSrc:   Oral Oral  Resp: 14 16 16 16   Height:      Weight:      SpO2: 93% 95% 96% 93%   Body mass index is 27.12 kg/(m^2).  Physical Exam  Constitutional:  He is alert and comfortable sitting up in a chair.  Musculoskeletal:  His right lower leg is wrapped.    Lab Results Lab Results  Component Value Date   WBC 7.6 04/02/2015   HGB 14.8 04/02/2015   HCT 42.5 04/02/2015   MCV 98.8 04/02/2015   PLT 257 04/02/2015    Lab Results  Component Value Date   CREATININE 0.82 04/02/2015   BUN 8 04/02/2015   NA 140 04/02/2015   K 4.3  04/02/2015   CL 107 04/02/2015   CO2 26 04/02/2015    Lab Results  Component Value Date   ALT 75* 04/02/2015   AST 65* 04/02/2015   ALKPHOS 67 04/02/2015   BILITOT 0.4 04/02/2015     Microbiology: Recent Results (from the past 240 hour(s))  Anaerobic culture     Status: None (Preliminary result)   Collection Time: 04/02/15 10:55 AM  Result Value Ref Range Status   Specimen Description WOUND RIGHT ANKLE  Final   Special Requests NONE  Final   Gram Stain   Final    RARE WBC PRESENT, PREDOMINANTLY PMN NO SQUAMOUS EPITHELIAL CELLS SEEN NO ORGANISMS SEEN Performed at [facility]    Culture PENDING  Incomplete   Report Status PENDING  Incomplete  Wound culture     Status: None (Preliminary result)   Collection Time: 04/02/15 10:55 AM  Result Value Ref Range Status   Specimen Description WOUND RIGHT ANKLE  Final   Special Requests NONE  Final   Gram Stain  Final    RARE WBC PRESENT, PREDOMINANTLY PMN NO SQUAMOUS EPITHELIAL CELLS SEEN NO ORGANISMS SEEN Performed at [facility]    Culture   Final    NO GROWTH 1 DAY Performed at [facility]    Report Status PENDING  Incomplete  Anaerobic culture     Status: None (Preliminary result)   Collection Time: 04/02/15 11:00 AM  Result Value Ref Range Status   Specimen Description WOUND RIGHT ANKLE  Final   Special Requests ADJACENT TO PLATE RIGHT ANKLE  Final   Gram Stain   Final    ABUNDANT WBC PRESENT,BOTH PMN AND MONONUCLEAR NO SQUAMOUS EPITHELIAL CELLS SEEN NO ORGANISMS SEEN Performed at [facility]    Culture PENDING  Incomplete   Report Status PENDING  Incomplete  Wound culture     Status: None (Preliminary result)   Collection Time: 04/02/15 11:00 AM  Result Value Ref Range Status   Specimen Description WOUND RIGHT ANKLE  Final   Special Requests ADJACENT TO PLATE RIGHT ANKLE  Final   Gram Stain   Final    ABUNDANT WBC PRESENT,BOTH PMN AND MONONUCLEAR NO SQUAMOUS EPITHELIAL  CELLS SEEN NO ORGANISMS SEEN Performed at [facility]    Culture   Final    NO GROWTH 1 DAY Performed at [facility]    Report Status PENDING  Incomplete  Anaerobic culture     Status: None (Preliminary result)   Collection Time: 04/02/15 11:02 AM  Result Value Ref Range Status   Specimen Description WOUND RIGHT ANKLE  Final   Special Requests DISTAL PLATE RIGHT ANKLE  Final   Gram Stain   Final    ABUNDANT WBC PRESENT, PREDOMINANTLY PMN NO SQUAMOUS EPITHELIAL CELLS SEEN NO ORGANISMS SEEN Performed at [facility]    Culture PENDING  Incomplete   Report Status PENDING  Incomplete  Wound culture     Status: None (Preliminary result)   Collection Time: 04/02/15 11:02 AM  Result Value Ref Range Status   Specimen Description WOUND RIGHT ANKLE  Final   Special Requests DISTAL PLATE RIGHT ANKLE  Final   Gram Stain   Final    ABUNDANT WBC PRESENT, PREDOMINANTLY PMN NO SQUAMOUS EPITHELIAL CELLS SEEN NO ORGANISMS SEEN Performed at [facility]    Culture   Final    Culture reincubated for better growth Performed at [facility]    Report Status PENDING  Incomplete     ASSESSMENT: Dr. Randel Pigg operative note indicates that the bone looked relatively healthy. All lateral hardware was removed. There was chronically infected tissue sitting over the plate that was removed. The fistula tract was excised. Operative Gram stain is negative and cultures are negative so far. Cultures could remain falsely negative because of recent clindamycin therapy. He is very eager to go home. I will have a PICC placed for IV vancomycin therapy. I will also treat him with oral ciprofloxacin for anaerobic gram-negative rod coverage. I will follow-up culture results and make any adjustments in antibiotic therapy as needed. I will arrange for him to see me in my clinic next week.  PLAN: 1. Continue IV vancomycin 2. Start oral ciprofloxacin 3. PICC placement 4. I  will await final culture results and see him back in clinic next week  Michel Bickers, MD [facility] for [place] [phone] pager   [phone] cell 04/03/2015, 3:11 PM

## 2015-04-03 NOTE — Evaluation (Signed)
Physical Therapy Evaluation Patient Details Name: Garrett Wilkinson MRN: [id number] DOB: [birth year] Today's Date: 04/03/2015   History of Present Illness  Admitted with pain and drainage ankle, ORIF due to fracture approx a year and a half ago; now s/p surgery fro I&D, remaval of hardware, and placement of antibiotic beads;   Clinical Impression   Patient evaluated by Physical Therapy with no further acute PT needs identified. All education has been completed and the patient has no further questions.  See below for any follow-up Physical Therapy or equipment needs. PT is signing off. Thank you for this referral.     Follow Up Recommendations Outpatient PT  The potential need for Outpatient PT can be addressed at Ortho follow-up appointments.     Equipment Recommendations  None recommended by PT    Recommendations for Other Services       Precautions / Restrictions Precautions Required Braces or Orthoses: Other Brace/Splint Other Brace/Splint: in PT orders, it states to weight bear as tolerated in fx boot, however there isn't an order for a boot (pt may have his own); there was no fx boot in the room; we proceeded gently, and I called Dr. Randel Pigg office and relayed this information to Abigail Butts Restrictions RLE Weight Bearing: Weight bearing as tolerated      Mobility  Bed Mobility                  Transfers Overall transfer level: Independent                  Ambulation/Gait Ambulation/Gait assistance: Modified independent (Device/Increase time) Ambulation Distance (Feet): 100 Feet Assistive device: None (and pushing IV pole) Gait Pattern/deviations: Step-through pattern;Decreased stance time - right Gait velocity: WFL   General Gait Details: Overall walking well; able to walk without UE support, but also seems to benefit from UE support pushing IV pole  Stairs Stairs: Yes       General stair comments: Pt described to me the technique and he is quite confident  in his ability to manage steps  Wheelchair Mobility    Modified Rankin (Stroke Patients Only)       Balance Overall balance assessment: No apparent balance deficits (not formally assessed)                                           Pertinent Vitals/Pain Pain Assessment: Faces Faces Pain Scale: Hurts little more Pain Location: R foot Pain Descriptors / Indicators: Grimacing (occasionally) Pain Intervention(s): Monitored during session    Home Living Family/patient expects to be discharged to:: Private residence Living Arrangements: Spouse/significant other Available Help at Discharge: Family Type of Home: House Home Access: Stairs to enter   Technical brewer of Steps: 4 Home Layout: One level Home Equipment: Walker - 2 wheels;Crutches;Bedside commode      Prior Function Level of Independence: Independent               Hand Dominance        Extremity/Trunk Assessment   Upper Extremity Assessment: Overall WFL for tasks assessed           Lower Extremity Assessment: RLE deficits/detail RLE Deficits / Details: Hip, knee WFL; Able to stand and bear weight in shoe; noting some active ankle dorsiflexion and plantarflexion without large incr in pain       Communication   Communication: No difficulties  Cognition Arousal/Alertness:  Awake/alert Behavior During Therapy: [facility] for tasks assessed/performed;Impulsive (somewhat impulsive; REALLY wanting to dc home) Overall Cognitive Status: Within Functional Limits for tasks assessed (though a bit impulsive/impatient)                      General Comments      Exercises        Assessment/Plan    PT Assessment All further PT needs can be met in the next venue of care  PT Diagnosis Difficulty walking   PT Problem List Decreased strength;Decreased range of motion;Decreased activity tolerance;Decreased balance;Pain  PT Treatment Interventions     PT Goals (Current goals can be  found in the Care Plan section) Acute Rehab PT Goals Patient Stated Goal: to get better PT Goal Formulation: All assessment and education complete, DC therapy    Frequency     Barriers to discharge        Co-evaluation               End of Session   Activity Tolerance: Patient tolerated treatment well Patient left: in chair;with call bell/phone within reach;with family/visitor present Nurse Communication: Mobility status         Time: 1410-1420 PT Time Calculation (min) (ACUTE ONLY): 10 min   Charges:   PT Evaluation $PT Eval Low Complexity: 1 Procedure     PT G CodesQuin Hoop 04/03/2015, 4:19 PM  Roney Marion, [place] Pager [phone] Office [phone]

## 2015-04-03 NOTE — Progress Notes (Signed)
Peripherally Inserted Central Catheter/Midline Placement  The IV Nurse has discussed with the patient and/or persons authorized to consent for the patient, the purpose of this procedure and the potential benefits and risks involved with this procedure.  The benefits include less needle sticks, lab draws from the catheter and patient may be discharged home with the catheter.  Risks include, but not limited to, infection, bleeding, blood clot (thrombus formation), and puncture of an artery; nerve damage and irregular heat beat.  Alternatives to this procedure were also discussed.  PICC/Midline Placement Documentation        [name] 04/03/2015, 5:58 PM

## 2015-04-04 DIAGNOSIS — T8469XA Infection and inflammatory reaction due to internal fixation device of other site, initial encounter: Secondary | ICD-10-CM | POA: Diagnosis not present

## 2015-04-04 LAB — WOUND CULTURE

## 2015-04-04 LAB — BASIC METABOLIC PANEL
Anion gap: 13 (ref 5–15)
BUN: 7 mg/dL (ref 6–20)
CALCIUM: 9.8 mg/dL (ref 8.9–10.3)
CO2: 25 mmol/L (ref 22–32)
CREATININE: 0.82 mg/dL (ref 0.61–1.24)
Chloride: 100 mmol/L — ABNORMAL LOW (ref 101–111)
GFR calc non Af Amer: >60 mL/min (ref >60)
Glucose, Bld: 105 mg/dL — ABNORMAL HIGH (ref 65–99)
Potassium: 3.9 mmol/L (ref 3.5–5.1)
SODIUM: 138 mmol/L (ref 135–145)

## 2015-04-04 LAB — VANCOMYCIN, TROUGH: Vancomycin Tr: 12 ug/mL (ref 10.0–20.0)

## 2015-04-04 MED ORDER — HEPARIN SOD (PORK) LOCK FLUSH 100 UNIT/ML IV SOLN
250.0000 [IU] | INTRAVENOUS | Status: AC | PRN
  Administered 2015-04-04: 250 [IU]

## 2015-04-04 MED ORDER — VANCOMYCIN HCL 10 G IV SOLR
1250.0000 mg | Freq: Three times a day (TID) | INTRAVENOUS | Status: DC
  Filled 2015-04-04 (×2): qty 1250

## 2015-04-04 NOTE — Progress Notes (Signed)
Pharmacy Antibiotic Follow-up Note  Garrett Wilkinson is a 55 y.o. year-old male admitted on 04/02/2015.  The patient is currently on day 3 of vancomycin for osteo. VT = 12 (goal 15-20)  Assessment/Plan: The dose of Vancomycin will be adjusted to 1250mg  IV q8hr. based on renal function.   ID: AF, WBC wnl. Vancomycin for osteo and chronic wound infection following ankle sx around 18 months ago. S/p sx for removal of hardware and extensive debridement. Operative Gram stain is negative and cultures are negative so far. Cultures could remain falsely negative because of recent clindamycin therapy.   Temp (24hrs), Avg:98.6 F (37 C), Min:98.2 F (36.8 C), Max:98.8 F (37.1 C)   Recent Labs Lab 04/02/15 0732  WBC 7.6    Recent Labs Lab 04/02/15 0732 04/04/15 1100  CREATININE 0.82 0.82   Estimated Creatinine Clearance: 121.5 mL/min (by C-G formula based on Cr of 0.82).    No Known Allergies  Antimicrobials this admission: Wound culture 1/10 >> Vanc 1/10 >> --1/12 VT 12:   Levels/dose changes this admission: 1/12: VT=12  Microbiology results: NGTD  Thank you for allowing pharmacy to be a part of this patient's care.   Seng Larch S. Alford Highland, PharmD, BCPS Clinical Staff Pharmacist Pager [phone]  Wayland Salinas PharmD 04/04/2015 11:46 AM

## 2015-04-04 NOTE — Progress Notes (Addendum)
Received return call from [place] Dani Gobble, she stated that once orders are received I need to fax them to ID clinic at [phone] along with H and P, PICC notes, op note, labs and ID notes. Once orders are received will page Mechele Claude at [phone]. Updated patient that I am awaiting orders and have contacted [place] social worker.    Notified by patient's RN that patient may not receive PICC until late this evening or in am. Contacted Ms Mock at [place], she stated that they will not review patient's information until the PICC note is present, they have to have all needed information prior to review. She stated that patient can work with [place] for home IV. Contacted Adam at [facility], faxed requested information and received confirmation. Will fax IV vancomycin order once order placed and they will be able to service patient once they receive the vancomycin order and approval from the [place]. Explained [facility] process to patient and his wife. Will continue follow up in am.  04/04/15  Faxed vancomycin order along with demographics, H and P, op note, PICC note, labs and MD progress notes to [place]  ID clinic, at 9:40am and received confirmation. Contacted Ms Mock who advised that information be faxed to patient's PCP as well. Faxed information to Dr. Collins [place] [phone] and received confirmation. Faxed Vancomycin dose and PICC note to Adam at [facility], received confirmation. Vancomycin dose changed to 1200mg  q8. Faxed new rx to [facility] ID clinic, Dr. Jamse Mead and Axela. Received call from Beckey Downing at [place], [phone], the initial dose of vancomycin has been approved, she will check on getting the auth updated to the new Vancomycin .rx for 1200mg . Contacted Adam at [employer] and he will contact me as soon as he receives approval from [place] for new dose.He asked that patient not discharge until they receive [facility] approval.    3pm Received call from [place] with [place], they have received authorization for the  Vancomycin 1200mg  q8h. They will start service at 7pm this evening for pm dose. Informed Adam that labs need to be called to Dr. Megan Salon and gave him office phone number. Informed patient and his wife of Josem Kaufmann and Axela will be out at 7pm.

## 2015-04-04 NOTE — Progress Notes (Signed)
Pt ready for d/c home per MD. PICC line placed 1/11, Dr. Marlou Sa wrote paper prescriptions for ciproflaxin and vanc. Pt was given extra provena wound vac canister to use if needed, wound vac representative checked with pt before d/c and stated it looked great. Discharge instructions and prescriptions were reviewed with pt and wife, denied any questions. Pt walked self out to car.   Raquel James  04/04/2015

## 2015-04-04 NOTE — Progress Notes (Signed)
Patient ID: Garrett Wilkinson, male   DOB: [birth year], 56 y.o.   MRN: [id number]         [facility] for Infectious Disease    Date of Admission:  03/20/2015   Total days of antibiotics 3         His PICC has been placed and he is ready for discharge. Operative cultures are not final but are growing group B strep and gram-negative rods. I will continue IV vancomycin and oral ciprofloxacin for now. I will monitor final culture results and call him to make any adjustments in his antibiotic therapy that are needed. He will follow-up with me in one week.         Michel Bickers, MD [facility] for [facility] [place] [facility] [phone] pager   [phone] cell 03/26/2015, 1:32 PM

## 2015-04-05 ENCOUNTER — Telehealth: Payer: Self-pay | Admitting: Internal Medicine

## 2015-04-05 ENCOUNTER — Other Ambulatory Visit: Payer: Self-pay | Admitting: Internal Medicine

## 2015-04-05 DIAGNOSIS — M869 Osteomyelitis, unspecified: Secondary | ICD-10-CM

## 2015-04-05 LAB — WOUND CULTURE

## 2015-04-05 MED ORDER — CEFTRIAXONE SODIUM 2 G IV SOLR: 2.0000 g | INTRAVENOUS | Status: DC

## 2015-04-05 NOTE — Telephone Encounter (Signed)
His operative cultures have grown group B strep and Serratia. I will change his vancomycin and ciprofloxacin to IV ceftriaxone. He will follow-up with me next week.

## 2015-04-07 LAB — ANAEROBIC CULTURE

## 2015-04-07 NOTE — Progress Notes (Signed)
Physical Therapy Note  (Late entry for G Code correction)   May 02, 2015 1620  PT G-Codes **NOT FOR INPATIENT CLASS**  Functional Assessment Tool Used Clinical Judgement  Functional Limitation Mobility: Walking and moving around  Mobility: Walking and Moving Around Current Status [id number]) CH  Mobility: Walking and Moving Around Goal Status [id number]) CH  Mobility: Walking and Moving Around Discharge Status [phone]) [place], [place] Pager [phone] Office [phone]

## 2015-04-09 NOTE — Discharge Summary (Signed)
Physician Discharge Summary  Patient ID: Amery Jara MRN: [id number] DOB/AGE: Dec 14, 1960 55 y.o.  Admit date: 04/02/2015 Discharge date: 04/04/2015  Admission Diagnoses:  Principal Problem:   Infection of bone of right ankle [facility])   Discharge Diagnoses:  Same  Surgeries: Procedure(s): Right Ankle HARDWARE REMOVAL, PLACEMENT OF ANTIBIOTIC BEADS, PROVENA WOULD VAC   on 04/02/2015   Consultants:    Discharged Condition: Stable  Hospital Course: Coda Choto is an 55 y.o. male who was admitted 04/02/2015 with a chief complaint of right ankle infection, and found to have a diagnosis of Infection of bone of right ankle ([place]).  They were brought to the operating room on 04/02/2015 and underwent the above named procedures.Did well post op - seen by ID for abx recs and pic line placement. Dced to home in good condition.    Antibiotics given:  Anti-infectives    Start     Dose/Rate Route Frequency Ordered Stop   04/04/15 1900  vancomycin (VANCOCIN) 1,250 mg in sodium chloride 0.9 % 250 mL IVPB  Status:  Discontinued     1,250 mg 166.7 mL/hr over 90 Minutes Intravenous Every 8 hours 04/04/15 1144 04/04/15 1915   04/03/15 2000  ciprofloxacin (CIPRO) tablet 750 mg  Status:  Discontinued     750 mg Oral 2 times daily 04/03/15 1515 04/04/15 1915   04/02/15 2300  vancomycin (VANCOCIN) IVPB 1000 mg/200 mL premix  Status:  Discontinued     1,000 mg 200 mL/hr over 60 Minutes Intravenous Every 12 hours 04/02/15 1440 04/02/15 1610   04/02/15 1900  vancomycin (VANCOCIN) IVPB 1000 mg/200 mL premix  Status:  Discontinued     1,000 mg 200 mL/hr over 60 Minutes Intravenous Every 8 hours 04/02/15 1549 04/04/15 1144   04/02/15 1108  vancomycin (VANCOCIN) powder  Status:  Discontinued       As needed 04/02/15 1108 04/02/15 1234   04/02/15 1108  gentamicin (GARAMYCIN) injection  Status:  Discontinued       As needed 04/02/15 1109 04/02/15 1234    .  Recent vital signs:  Filed Vitals:   04/03/15 2050  04/04/15 0634  BP: 127/84 135/88  Pulse: 92 83  Temp: 98.2 F (36.8 C) 98.8 F (37.1 C)  Resp: 18 18    Recent laboratory studies:  Results for orders placed or performed during the hospital encounter of 04/02/15  Anaerobic culture  Result Value Ref Range   Specimen Description WOUND RIGHT ANKLE    Special Requests NONE    Gram Stain      RARE WBC PRESENT, PREDOMINANTLY PMN NO SQUAMOUS EPITHELIAL CELLS SEEN NO ORGANISMS SEEN Performed at [facility]    Culture      NO ANAEROBES ISOLATED Performed at [facility]    Report Status 04/07/2015 FINAL   Wound culture  Result Value Ref Range   Specimen Description WOUND RIGHT ANKLE    Special Requests NONE    Gram Stain      RARE WBC PRESENT, PREDOMINANTLY PMN NO SQUAMOUS EPITHELIAL CELLS SEEN NO ORGANISMS SEEN Performed at [facility]    Culture      FEW GROUP B STREP(S.AGALACTIAE)ISOLATED Note: Beta hemolytic streptococci are predictably susceptible to penicillin and other beta lactams. Susceptibility testing not routinely performed. Performed at [facility]    Report Status 04/04/2015 FINAL   Anaerobic culture  Result Value Ref Range   Specimen Description WOUND RIGHT ANKLE    Special Requests ADJACENT TO PLATE RIGHT ANKLE  Gram Stain      ABUNDANT WBC PRESENT,BOTH PMN AND MONONUCLEAR NO SQUAMOUS EPITHELIAL CELLS SEEN NO ORGANISMS SEEN Performed at [facility]    Culture      NO ANAEROBES ISOLATED Performed at [facility]    Report Status 04/07/2015 FINAL   Wound culture  Result Value Ref Range   Specimen Description WOUND RIGHT ANKLE    Special Requests ADJACENT TO PLATE RIGHT ANKLE    Gram Stain      ABUNDANT WBC PRESENT,BOTH PMN AND MONONUCLEAR NO SQUAMOUS EPITHELIAL CELLS SEEN NO ORGANISMS SEEN Performed at [facility]    Culture      MODERATE GROUP B STREP(S.AGALACTIAE)ISOLATED Note: Beta hemolytic streptococci are predictably  susceptible to penicillin and other beta lactams. Susceptibility testing not routinely performed. Performed at [facility]    Report Status 04/04/2015 FINAL   Anaerobic culture  Result Value Ref Range   Specimen Description WOUND RIGHT ANKLE    Special Requests DISTAL PLATE RIGHT ANKLE    Gram Stain      ABUNDANT WBC PRESENT, PREDOMINANTLY PMN NO SQUAMOUS EPITHELIAL CELLS SEEN NO ORGANISMS SEEN Performed at [facility]    Culture      NO ANAEROBES ISOLATED Performed at [facility]    Report Status 04/07/2015 FINAL   Wound culture  Result Value Ref Range   Specimen Description WOUND RIGHT ANKLE    Special Requests DISTAL PLATE RIGHT ANKLE    Gram Stain      ABUNDANT WBC PRESENT, PREDOMINANTLY PMN NO SQUAMOUS EPITHELIAL CELLS SEEN NO ORGANISMS SEEN Performed at [place] B STREP(S.AGALACTIAE)ISOLATED Note: Beta hemolytic streptococci are predictably susceptible to penicillin and other beta lactams. Susceptibility testing not routinely performed. Performed at [facility]    Report Status 04/05/2015 FINAL    Organism ID, Bacteria SERRATIA MARCESCENS       Susceptibility   Serratia marcescens - MIC*    CEFAZOLIN >=64 RESISTANT Resistant     CEFEPIME <=1 SENSITIVE Sensitive     CEFTAZIDIME <=1 SENSITIVE Sensitive     CEFTRIAXONE <=1 SENSITIVE Sensitive     CIPROFLOXACIN <=0.25 SENSITIVE Sensitive     GENTAMICIN <=1 SENSITIVE Sensitive     TOBRAMYCIN 2 SENSITIVE Sensitive     TRIMETH/SULFA Value in next row Sensitive      <=20 SENSITIVE(NOTE)    * FEW SERRATIA MARCESCENS  CBC  Result Value Ref Range   WBC 7.6 4.0 - 10.5 K/uL   RBC 4.30 4.22 - 5.81 MIL/uL   Hemoglobin 14.8 13.0 - 17.0 g/dL   HCT 42.5 39.0 - 52.0 %   MCV 98.8 78.0 - 100.0 fL   MCH 34.4 (H) 26.0 - 34.0 pg   MCHC 34.8 30.0 - 36.0 g/dL   RDW 12.9 11.5 - 15.5 %   Platelets 257 150 - 400 K/uL  Comprehensive  metabolic panel  Result Value Ref Range   Sodium 140 135 - 145 mmol/L   Potassium 4.3 3.5 - 5.1 mmol/L   Chloride 107 101 - 111 mmol/L   CO2 26 22 - 32 mmol/L   Glucose, Bld 108 (H) 65 - 99 mg/dL   BUN 8 6 - 20 mg/dL   Creatinine, Ser 0.82 0.61 - 1.24 mg/dL   Calcium 9.2 8.9 - 10.3 mg/dL   Total Protein 7.6 6.5 - 8.1 g/dL   Albumin 4.1 3.5 - 5.0 g/dL  AST 65 (H) 15 - 41 U/L   ALT 75 (H) 17 - 63 U/L   Alkaline Phosphatase 67 38 - 126 U/L   Total Bilirubin 0.4 0.3 - 1.2 mg/dL   GFR calc non Af Amer >60 >60 mL/min   GFR calc Af Amer >60 >60 mL/min   Anion gap 7 5 - 15  Vancomycin, trough  Result Value Ref Range   Vancomycin Tr 12 10.0 - 20.0 ug/mL  Basic metabolic panel  Result Value Ref Range   Sodium 138 135 - 145 mmol/L   Potassium 3.9 3.5 - 5.1 mmol/L   Chloride 100 (L) 101 - 111 mmol/L   CO2 25 22 - 32 mmol/L   Glucose, Bld 105 (H) 65 - 99 mg/dL   BUN 7 6 - 20 mg/dL   Creatinine, Ser 0.82 0.61 - 1.24 mg/dL   Calcium 9.8 8.9 - 10.3 mg/dL   GFR calc non Af Amer >60 >60 mL/min   GFR calc Af Amer >60 >60 mL/min   Anion gap 13 5 - 15    Discharge Medications:     Medication List    STOP taking these medications        clindamycin 150 MG capsule  Commonly known as:  CLEOCIN     glucosamine-chondroitin 500-400 MG tablet     ibuprofen 400 MG tablet  Commonly known as:  ADVIL,MOTRIN      TAKE these medications        aspirin 325 MG tablet  Take 1 tablet (325 mg total) by mouth daily.     MULTIVITAMINS PO  Take 1 tablet by mouth daily.     oxyCODONE 5 MG immediate release tablet  Commonly known as:  Oxy IR/ROXICODONE  Take 1-2 tablets (5-10 mg total) by mouth every 3 (three) hours as needed for breakthrough pain.     pseudoephedrine 30 MG tablet  Commonly known as:  SUDAFED  Take 30 mg by mouth every 4 (four) hours as needed for congestion.        Diagnostic Studies: Dg Ankle Right Port  04/02/2015  CLINICAL DATA:  55 year old male status post ORIF at  an outside facility for bimalleolar right ankle fracture 1.5 years ago. Recurrent wound drainage. Seen recently at the [facility] with CT scan demonstrating hardware loosening without definite osteomyelitis. Status post hardware removal and placement of antibiotic beads and wound VAC. Initial encounter. EXAM: PORTABLE RIGHT ANKLE - 2 VIEW COMPARISON:  None. FINDINGS: Portable AP and cross-table lateral views of the right ankle. Wound VAC material along the lateral aspect. Hardware removal from the distal fibula. Numerous radiopaque beads placed about the distal fibula. Hardware remains at the medial malleolus and appears intact aside from a small screw fragment at the tibial plafond. Mortise joint alignment preserved. Possible small ankle joint effusion. Calcaneus appears intact. IMPRESSION: Postoperative changes including a hardware removal and antibiotic bead placement about the distal fibula. Electronically Signed   By: Genevie Ann M.D.   On: 04/02/2015 13:29    Disposition: 06-Home-Health Care Svc      Discharge Instructions    Call MD / Call 911    Complete by:  As directed   If you experience chest pain or shortness of breath, CALL 911 and be transported to the hospital emergency room.  If you develope a fever above 101 F, pus (white drainage) or increased drainage or redness at the wound, or calf pain, call your surgeon's office.     Call MD / Call  911    Complete by:  As directed   If you experience chest pain or shortness of breath, CALL 911 and be transported to the hospital emergency room.  If you develope a fever above 101 F, pus (white drainage) or increased drainage or redness at the wound, or calf pain, call your surgeon's office.     Constipation Prevention    Complete by:  As directed   Drink plenty of fluids.  Prune juice may be helpful.  You may use a stool softener, such as Colace (over the counter) 100 mg twice a day.  Use MiraLax (over the counter) for constipation as needed.      Constipation Prevention    Complete by:  As directed   Drink plenty of fluids.  Prune juice may be helpful.  You may use a stool softener, such as Colace (over the counter) 100 mg twice a day.  Use MiraLax (over the counter) for constipation as needed.     Diet - low sodium heart healthy    Complete by:  As directed      Diet - low sodium heart healthy    Complete by:  As directed      Discharge instructions    Complete by:  As directed   Weight bearing as tolerated right leg in fracture boot Keep sponge suction in place and dry Ok to remove ace wrap Thursday     Increase activity slowly as tolerated    Complete by:  As directed      Increase activity slowly as tolerated    Complete by:  As directed            Follow-up Information    Follow up with [facility].   Why:  They will contact you to schedule nursing visits.     Contact information:   telephone # [phone]       Signed: Meredith Pel 04/09/2015, 2:08 PM

## 2015-04-10 ENCOUNTER — Encounter: Payer: Self-pay | Admitting: Internal Medicine

## 2015-04-10 ENCOUNTER — Ambulatory Visit (INDEPENDENT_AMBULATORY_CARE_PROVIDER_SITE_OTHER): Payer: Non-veteran care | Admitting: Internal Medicine

## 2015-04-10 ENCOUNTER — Ambulatory Visit: Payer: Non-veteran care | Admitting: Internal Medicine

## 2015-04-10 VITALS — BP 147/94 | HR 112 | Temp 98.7°F | Wt 215.0 lb

## 2015-04-10 DIAGNOSIS — M869 Osteomyelitis, unspecified: Secondary | ICD-10-CM | POA: Diagnosis not present

## 2015-04-10 NOTE — Assessment & Plan Note (Signed)
He has a chronic polymicrobial it abscess overlying bone and the hardware that was recently removed. It did not appear that he had well-established osteomyelitis. I will give him 2 more weeks of IV antibiotics and then reevaluate.

## 2015-04-10 NOTE — Progress Notes (Signed)
[place] for Infectious Disease  Patient Active Problem List   Diagnosis Date Noted  . Infection of bone of right ankle ([place]) 04/02/2015    Priority: High  . ADHESIVE CAPSULITIS 05/23/2010  . SINUSITIS - ACUTE-NOS 01/31/2010  . MUSCLE STRAIN 01/08/2010  . DEPRESSION, RECURRENT 10/02/2009  . FATIGUE 10/02/2009    Patient's Medications  New Prescriptions   No medications on file  Previous Medications   ASPIRIN 325 MG TABLET    Take 1 tablet (325 mg total) by mouth daily.   CEFTRIAXONE (ROCEPHIN) 2 G SOLR INJECTION    Inject 2 g into the vein daily.   MULTIPLE VITAMIN (MULTIVITAMINS PO)    Take 1 tablet by mouth daily.    OXYCODONE (OXY IR/ROXICODONE) 5 MG IMMEDIATE RELEASE TABLET    Take 1-2 tablets (5-10 mg total) by mouth every 3 (three) hours as needed for breakthrough pain.   PSEUDOEPHEDRINE (SUDAFED) 30 MG TABLET    Take 30 mg by mouth every 4 (four) hours as needed for congestion.  Modified Medications   No medications on file  Discontinued Medications   No medications on file    Subjective: Mr. Zuk is in for his hospital follow-up visit. He fractured his right ankle and 2015 and underwent open reduction and internal fixation medially and laterally. He has had problems with chronic drainage from his lateral ankle wound and he was admitted to the hospital and underwent I&D and removal of the lateral hardware last week by Dr. Alphonzo Severance. There was no definite evidence of osteomyelitis but there was abscess directly overlying the hardware that was removed. Operative cultures grew group B strep and Serratia both sensitive to ceftriaxone. He is now completed 8 days of therapy. He has had no problems tolerating his antibiotic or PICC. He saw Dr. Marlou Sa this morning and had the [facility] dressing removed. He still has sutures in. He is having no pain and not requiring any pain medication  Review of Systems: Review of Systems  Constitutional: Negative for fever, chills  and diaphoresis.  Gastrointestinal: Negative for nausea, vomiting, abdominal pain and diarrhea.  Musculoskeletal: Negative for joint pain.    Past Medical History  Diagnosis Date  . Allergy   . Depression     "years ago"  . GERD (gastroesophageal reflux disease)     rare  . Fatty liver     Social History  Substance Use Topics  . Smoking status: Current Every Day Smoker -- 0.50 packs/day for 30 years    Types: Cigarettes  . Smokeless tobacco: Never Used     Comment: He has been trying to wean.    . Alcohol Use: 6.0 oz/week    12 Standard drinks or equivalent per week     Comment: social    Family History  Problem Relation Age of Onset  . Hypertension Father   . Diabetes Maternal Grandmother     No Known Allergies  Objective: Filed Vitals:   04/10/15 1538  BP: 147/94  Pulse: 112  Temp: 98.7 F (37.1 C)  TempSrc: Oral  Weight: 215 lb (97.523 kg)   Body mass index is 27.24 kg/(m^2).  Physical Exam  Constitutional:  He is smiling and in good spirits.  Musculoskeletal:  He has a clean dry dressing over his right lateral ankle incision.    Lab Results    Problem List Items Addressed This Visit      High   Infection of bone of  right ankle ([place]) - Primary    He has a chronic polymicrobial it abscess overlying bone and the hardware that was recently removed. It did not appear that he had well-established osteomyelitis. I will give him 2 more weeks of IV antibiotics and then reevaluate.          Michel Bickers, MD [facility] for [facility] [phone] pager   [phone] cell 04/10/2015, 3:57 PM

## 2015-04-11 ENCOUNTER — Ambulatory Visit: Payer: Non-veteran care | Admitting: Internal Medicine

## 2015-05-09 ENCOUNTER — Encounter: Payer: Self-pay | Admitting: Internal Medicine

## 2015-05-09 ENCOUNTER — Ambulatory Visit (INDEPENDENT_AMBULATORY_CARE_PROVIDER_SITE_OTHER): Payer: Non-veteran care | Admitting: Internal Medicine

## 2015-05-09 VITALS — Wt 213.8 lb

## 2015-05-09 DIAGNOSIS — M869 Osteomyelitis, unspecified: Secondary | ICD-10-CM

## 2015-05-09 NOTE — Assessment & Plan Note (Signed)
I'm hopeful that his chronic right ankle abscess has now been cured following removal of his hardware in 5 weeks of IV ceftriaxone. I will stop his ceftriaxone and have his PICC removed. He will follow-up with me in 6 weeks.

## 2015-05-09 NOTE — Progress Notes (Signed)
[place] for Infectious Disease  Patient Active Problem List   Diagnosis Date Noted  . Infection of bone of right ankle ([place]) 04/02/2015    Priority: High  . ADHESIVE CAPSULITIS 05/23/2010  . SINUSITIS - ACUTE-NOS 01/31/2010  . MUSCLE STRAIN 01/08/2010  . DEPRESSION, RECURRENT 10/02/2009  . FATIGUE 10/02/2009    Patient's Medications  New Prescriptions   No medications on file  Previous Medications   ASPIRIN 325 MG TABLET    Take 1 tablet (325 mg total) by mouth daily.   MULTIPLE VITAMIN (MULTIVITAMINS PO)    Take 1 tablet by mouth daily.    OXYCODONE (OXY IR/ROXICODONE) 5 MG IMMEDIATE RELEASE TABLET    Take 1-2 tablets (5-10 mg total) by mouth every 3 (three) hours as needed for breakthrough pain.   PSEUDOEPHEDRINE (SUDAFED) 30 MG TABLET    Take 30 mg by mouth every 4 (four) hours as needed for congestion.  Modified Medications   No medications on file  Discontinued Medications   CEFTRIAXONE (ROCEPHIN) 2 G SOLR INJECTION    Inject 2 g into the vein daily.    Subjective: Garrett Wilkinson is in for his routine follow-up visit. He is now completed about 5 weeks of IV ceftriaxone for his group B streptococcal and Serratia infection of his right ankle. He was hospitalized last month and had hardware removed from his lateral ankle. He had a chronic abscess sitting over the hardware but his bone appeared healthy. He has not had any problems tolerating his PICC or ceftriaxone.  Review of Systems: Review of Systems  Constitutional: Negative for fever, chills and diaphoresis.  Musculoskeletal: Positive for joint pain.       He states that his right ankle pain is minimal now.    Past Medical History  Diagnosis Date  . Allergy   . Depression     "years ago"  . GERD (gastroesophageal reflux disease)     rare  . Fatty liver     Social History  Substance Use Topics  . Smoking status: Current Every Day Smoker -- 0.50 packs/day for 30 years    Types: Cigarettes  .  Smokeless tobacco: Never Used     Comment: He has been trying to wean.    . Alcohol Use: 7.2 oz/week    12 Standard drinks or equivalent per week     Comment: social    Family History  Problem Relation Age of Onset  . Hypertension Father   . Diabetes Maternal Grandmother     No Known Allergies  Objective: Filed Vitals:   05/09/15 0935  Weight: 213 lb 12 oz (96.956 kg)   Body mass index is 27.09 kg/(m^2).  Physical Exam  Constitutional:  He is smiling and in good spirits.  Cardiovascular: Normal rate and regular rhythm.   No murmur heard. Pulmonary/Chest: Breath sounds normal.  Musculoskeletal:  His right ankle incision is healing slowly. There is still one small area in the upper portion of the incision that is open. There is a small amount of clear yellow drainage. This area is about a centimeter in diameter and is much smaller now.  Skin: No rash noted.  Right arm PICC site looks good.  Psychiatric: Mood and affect normal.    Lab Results    Problem List Items Addressed This Visit      High   Infection of bone of right ankle ([place]) - Primary    I'm hopeful that his chronic  right ankle abscess has now been cured following removal of his hardware in 5 weeks of IV ceftriaxone. I will stop his ceftriaxone and have his PICC removed. He will follow-up with me in 6 weeks.          Garrett Bickers, MD [facility] for Infectious [place] [facility] [phone] pager   [phone] cell 05/09/2015, 10:01 AM

## 2015-06-20 ENCOUNTER — Ambulatory Visit (INDEPENDENT_AMBULATORY_CARE_PROVIDER_SITE_OTHER): Payer: No Typology Code available for payment source | Admitting: Internal Medicine

## 2015-06-20 ENCOUNTER — Encounter: Payer: Self-pay | Admitting: Internal Medicine

## 2015-06-20 VITALS — BP 138/88 | HR 101 | Temp 98.7°F | Wt 216.0 lb

## 2015-06-20 DIAGNOSIS — L02611 Cutaneous abscess of right foot: Secondary | ICD-10-CM

## 2015-06-20 NOTE — Progress Notes (Signed)
Patient ID: Garrett Wilkinson, male   DOB: [birth year], 55 y.o.   MRN: [id number]         [facility] for Infectious Disease  Patient Active Problem List   Diagnosis Date Noted  . Foot abscess, right 04/02/2015    Priority: High  . ADHESIVE CAPSULITIS 05/23/2010  . SINUSITIS - ACUTE-NOS 01/31/2010  . MUSCLE STRAIN 01/08/2010  . DEPRESSION, RECURRENT 10/02/2009  . FATIGUE 10/02/2009    Patient's Medications  New Prescriptions   No medications on file  Previous Medications   ASPIRIN 325 MG TABLET    Take 1 tablet (325 mg total) by mouth daily.   MULTIPLE VITAMIN (MULTIVITAMINS PO)    Take 1 tablet by mouth daily.    OXYCODONE (OXY IR/ROXICODONE) 5 MG IMMEDIATE RELEASE TABLET    Take 1-2 tablets (5-10 mg total) by mouth every 3 (three) hours as needed for breakthrough pain.   PSEUDOEPHEDRINE (SUDAFED) 30 MG TABLET    Take 30 mg by mouth every 4 (four) hours as needed for congestion.  Modified Medications   No medications on file  Discontinued Medications   No medications on file    Subjective: Mr. Sebo is in for his routine follow-up visit. He completed 5 weeks of IV ceftriaxone for his group B streptococcal and Serratia infection of his right ankle on 05/09/2015. He was hospitalized last month and had hardware removed from his lateral ankle. He had a chronic abscess sitting over the hardware but his bone appeared healthy.   Review of Systems: Review of Systems  Constitutional: Negative for fever, chills and diaphoresis.  Musculoskeletal: Positive for joint pain.       He states that his right ankle pain is minimal now.    Past Medical History  Diagnosis Date  . Allergy   . Depression     "years ago"  . GERD (gastroesophageal reflux disease)     rare  . Fatty liver     Social History  Substance Use Topics  . Smoking status: Current Every Day Smoker -- 0.50 packs/day for 30 years    Types: Cigarettes  . Smokeless tobacco: Never Used     Comment: He has been trying  to wean.    . Alcohol Use: 7.2 oz/week    12 Standard drinks or equivalent per week     Comment: social    Family History  Problem Relation Age of Onset  . Hypertension Father   . Diabetes Maternal Grandmother     No Known Allergies  Objective: Filed Vitals:   06/20/15 1427  BP: 138/88  Pulse: 101  Temp: 98.7 F (37.1 C)  TempSrc: Oral  Weight: 216 lb (97.977 kg)   Body mass index is 27.37 kg/(m^2).  Physical Exam  Constitutional:  He is in good spirits.  Cardiovascular: Normal rate and regular rhythm.   No murmur heard. Pulmonary/Chest: Breath sounds normal.  Musculoskeletal:  He still has one small area in the upper portion of his right lateral ankle wound that is not fully healed. It measures about 0.5" x 1 8 inch. There is a scant amount of yellow drainage on his gauze dressing. There is no surrounding cellulitis or fluctuance.  Skin: No rash noted.  Psychiatric: Mood and affect normal.    Lab Results No results found for: ESRSEDRATE, CRP   Problem List Items Addressed This Visit      High   Foot abscess, right - Primary    I suspect his slow wound healing is related  more to scar tissue and his continued cigarette smoking than any active infection. He will continue off of antibiotics and follow-up here in 6 weeks. He knows call me if he has any signs or symptoms suggesting relapse of infection. He is set this coming Sunday as his cigarette quit date.          Michel Bickers, MD [facility] for [facility] [phone] pager   [phone] cell 06/20/2015, 2:43 PM

## 2015-06-20 NOTE — Assessment & Plan Note (Signed)
I suspect his slow wound healing is related more to scar tissue and his continued cigarette smoking than any active infection. He will continue off of antibiotics and follow-up here in 6 weeks. He knows call me if he has any signs or symptoms suggesting relapse of infection. He is set this coming Sunday as his cigarette quit date.

## 2015-08-08 ENCOUNTER — Ambulatory Visit: Payer: No Typology Code available for payment source | Admitting: Internal Medicine
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
## 2019-11-08 ENCOUNTER — Emergency Department (HOSPITAL_BASED_OUTPATIENT_CLINIC_OR_DEPARTMENT_OTHER)
Admission: EM | Admit: 2019-11-08 | Discharge: 2019-11-08 | Disposition: A | Discharge status: Home / Self Care | Payer: No Typology Code available for payment source | Attending: Emergency Medicine | Admitting: Emergency Medicine

## 2019-11-08 ENCOUNTER — Encounter (HOSPITAL_BASED_OUTPATIENT_CLINIC_OR_DEPARTMENT_OTHER): Payer: Self-pay | Admitting: Emergency Medicine

## 2019-11-08 ENCOUNTER — Emergency Department (HOSPITAL_BASED_OUTPATIENT_CLINIC_OR_DEPARTMENT_OTHER): Payer: No Typology Code available for payment source

## 2019-11-08 ENCOUNTER — Other Ambulatory Visit: Payer: Self-pay

## 2019-11-08 DIAGNOSIS — R2241 Localized swelling, mass and lump, right lower limb: Secondary | ICD-10-CM | POA: Diagnosis present

## 2019-11-08 DIAGNOSIS — F17210 Nicotine dependence, cigarettes, uncomplicated: Secondary | ICD-10-CM | POA: Diagnosis not present

## 2019-11-08 DIAGNOSIS — I82401 Acute embolism and thrombosis of unspecified deep veins of right lower extremity: Secondary | ICD-10-CM | POA: Diagnosis not present

## 2019-11-08 DIAGNOSIS — I824Y1 Acute embolism and thrombosis of unspecified deep veins of right proximal lower extremity: Secondary | ICD-10-CM

## 2019-11-08 LAB — CBC WITH DIFFERENTIAL/PLATELET
Abs Immature Granulocytes: 0.02 10*3/uL (ref 0.00–0.07)
Basophils Absolute: 0.1 10*3/uL (ref 0.0–0.1)
Basophils Relative: 1 %
Eosinophils Absolute: 0.3 10*3/uL (ref 0.0–0.5)
Eosinophils Relative: 4 %
HCT: 42.5 % (ref 39.0–52.0)
Hemoglobin: 14.2 g/dL (ref 13.0–17.0)
Immature Granulocytes: 0 %
Lymphocytes Relative: 23 %
Lymphs Abs: 1.6 10*3/uL (ref 0.7–4.0)
MCH: 34.1 pg — ABNORMAL HIGH (ref 26.0–34.0)
MCHC: 33.4 g/dL (ref 30.0–36.0)
MCV: 102.2 fL — ABNORMAL HIGH (ref 80.0–100.0)
Monocytes Absolute: 1.2 10*3/uL — ABNORMAL HIGH (ref 0.1–1.0)
Monocytes Relative: 17 %
Neutro Abs: 3.8 10*3/uL (ref 1.7–7.7)
Neutrophils Relative %: 55 %
Platelets: 301 10*3/uL (ref 150–400)
RBC: 4.16 MIL/uL — ABNORMAL LOW (ref 4.22–5.81)
RDW: 12.9 % (ref 11.5–15.5)
WBC: 6.9 10*3/uL (ref 4.0–10.5)
nRBC: 0 % (ref 0.0–0.2)

## 2019-11-08 LAB — BASIC METABOLIC PANEL
Anion gap: 8 (ref 5–15)
BUN: 9 mg/dL (ref 6–20)
CO2: 27 mmol/L (ref 22–32)
Calcium: 9.7 mg/dL (ref 8.9–10.3)
Chloride: 102 mmol/L (ref 98–111)
Creatinine, Ser: 0.83 mg/dL (ref 0.61–1.24)
GFR calc Af Amer: >60 mL/min (ref >60)
GFR calc non Af Amer: >60 mL/min (ref >60)
Glucose, Bld: 102 mg/dL — ABNORMAL HIGH (ref 70–99)
Potassium: 4 mmol/L (ref 3.5–5.1)
Sodium: 137 mmol/L (ref 135–145)

## 2019-11-08 MED ORDER — RIVAROXABAN (XARELTO) VTE STARTER PACK (15 & 20 MG): ORAL_TABLET | ORAL | 0 refills | Status: AC

## 2019-11-08 MED ORDER — RIVAROXABAN 15 MG PO TABS
15.0000 mg | ORAL_TABLET | Freq: Once | ORAL | Status: AC
  Administered 2019-11-08: 15 mg via ORAL
  Filled 2019-11-08: qty 1

## 2019-11-08 NOTE — ED Triage Notes (Signed)
Right lower leg swelling.  Pt recently traveled to [place].
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
## 2019-11-08 NOTE — Discharge Instructions (Signed)
You were seen in the emergency department today with a deep vein thrombosis in your right leg which is causing the pain and swelling.  Please take the Xarelto as prescribed.  This medication can make it hard for bleeding to stop and if you experience sudden severe headache, blood in the bowel movements, blood in the vomit, or are involved in a motor vehicle accident you should seek emergency treatment immediately.  The blood clot is currently in your leg but has risk of moving to your lungs.  If you develop chest pain or shortness of breath you should also return to the emergency department immediately.  While taking Xarelto you cannot take aspirin, ibuprofen, other NSAIDs as this can increase your risk of bleeding. Please call your PCP to arrange a follow up appointment.

## 2021-11-07 ENCOUNTER — Encounter: Payer: Self-pay | Admitting: Gastroenterology

## 2022-06-19 ENCOUNTER — Encounter (HOSPITAL_COMMUNITY): Payer: Self-pay

## 2022-06-19 ENCOUNTER — Emergency Department (HOSPITAL_COMMUNITY)
Admission: EM | Admit: 2022-06-19 | Discharge: 2022-06-19 | Disposition: A | Discharge status: Home / Self Care | Payer: No Typology Code available for payment source | Attending: Emergency Medicine | Admitting: Emergency Medicine

## 2022-06-19 ENCOUNTER — Emergency Department (HOSPITAL_COMMUNITY): Payer: No Typology Code available for payment source

## 2022-06-19 ENCOUNTER — Other Ambulatory Visit: Payer: Self-pay

## 2022-06-19 DIAGNOSIS — R002 Palpitations: Secondary | ICD-10-CM | POA: Insufficient documentation

## 2022-06-19 DIAGNOSIS — I10 Essential (primary) hypertension: Secondary | ICD-10-CM | POA: Insufficient documentation

## 2022-06-19 DIAGNOSIS — R079 Chest pain, unspecified: Secondary | ICD-10-CM | POA: Insufficient documentation

## 2022-06-19 DIAGNOSIS — Z79899 Other long term (current) drug therapy: Secondary | ICD-10-CM | POA: Insufficient documentation

## 2022-06-19 DIAGNOSIS — R42 Dizziness and giddiness: Secondary | ICD-10-CM | POA: Diagnosis present

## 2022-06-19 LAB — CBC
HCT: 38.4 % — ABNORMAL LOW (ref 39.0–52.0)
Hemoglobin: 13.5 g/dL (ref 13.0–17.0)
MCH: 34.4 pg — ABNORMAL HIGH (ref 26.0–34.0)
MCHC: 35.2 g/dL (ref 30.0–36.0)
MCV: 98 fL (ref 80.0–100.0)
Platelets: 273 10*3/uL (ref 150–400)
RBC: 3.92 MIL/uL — ABNORMAL LOW (ref 4.22–5.81)
RDW: 13.1 % (ref 11.5–15.5)
WBC: 5.4 10*3/uL (ref 4.0–10.5)
nRBC: 0 % (ref 0.0–0.2)

## 2022-06-19 LAB — COMPREHENSIVE METABOLIC PANEL
ALT: 73 U/L — ABNORMAL HIGH (ref 0–44)
AST: 90 U/L — ABNORMAL HIGH (ref 15–41)
Albumin: 4.4 g/dL (ref 3.5–5.0)
Alkaline Phosphatase: 58 U/L (ref 38–126)
Anion gap: 10 (ref 5–15)
BUN: 9 mg/dL (ref 8–23)
CO2: 26 mmol/L (ref 22–32)
Calcium: 9.9 mg/dL (ref 8.9–10.3)
Chloride: 99 mmol/L (ref 98–111)
Creatinine, Ser: 0.91 mg/dL (ref 0.61–1.24)
GFR, Estimated: >60 mL/min (ref >60)
Glucose, Bld: 114 mg/dL — ABNORMAL HIGH (ref 70–99)
Potassium: 3.7 mmol/L (ref 3.5–5.1)
Sodium: 135 mmol/L (ref 135–145)
Total Bilirubin: 0.9 mg/dL (ref 0.3–1.2)
Total Protein: 7.7 g/dL (ref 6.5–8.1)

## 2022-06-19 LAB — D-DIMER, QUANTITATIVE: D-Dimer, Quant: 0.35 ug/mL-FEU (ref 0.00–0.50)

## 2022-06-19 LAB — TROPONIN I (HIGH SENSITIVITY)
Troponin I (High Sensitivity): 6 ng/L (ref <18)
Troponin I (High Sensitivity): 5 ng/L (ref <18)

## 2022-06-19 LAB — TSH: TSH: 0.819 u[IU]/mL (ref 0.350–4.500)

## 2022-06-19 NOTE — ED Notes (Signed)
Provider notified that second troponin is back.  Pt and visitor expressing interest in possible discharge.

## 2022-06-19 NOTE — ED Notes (Signed)
Pt received for care at 1905.  Quietly resting in bed; respirations even and unlabored.  This RN introduced self to pt and visitor at bedside.  Pt/visitor inquiring about if second troponin is back/estimate on how long until result.  Both members updated that we are waiting on pt's second trop result but unknown estimate at this time.  Bed in lowest position, wheels locked.  Call bell within reach.

## 2022-06-19 NOTE — ED Provider Triage Note (Signed)
Emergency Medicine Provider Triage Evaluation Note  Garrett Wilkinson , a 62 y.o. male  was evaluated in triage.  Pt complains of chest pain.  He has a past medical history of GERD, smoking and daily beer (2 ,12 oz).  Patient had a meal of sardines earlier today and the onset of dizziness, chest pressure, racing heart and paresthesias on the left arm.  His brother who is 10 years younger than him had had a heart attack 2 months ago which made him very nervous he came in for evaluation.  He takes 325 of aspirin daily in the morning which he already took today.  Review of Systems  Positive: Chest discomfort Negative: fever  Physical Exam  BP (!) 153/102 (BP Location: Right Arm)   Pulse (!) 116   Temp 98.4 F (36.9 C) (Oral)   Resp 17   Ht 6' 2.5" (1.892 m)   Wt 90.7 kg   SpO2 99%   BMI 25.33 kg/m  Gen:   Awake, no distress   Resp:  Normal effort  MSK:   Moves extremities without difficulty  Other:  tachychardic  Medical Decision Making  Medically screening exam initiated at 3:31 PM.  Appropriate orders placed.  Garrett Wilkinson was informed that the remainder of the evaluation will be completed by another provider, this initial triage assessment does not replace that evaluation, and the importance of remaining in the ED until their evaluation is complete.     Margarita Mail, PA-C 06/19/22 1544

## 2022-06-19 NOTE — ED Provider Notes (Signed)
[facility] Provider Note   CSN: [id number] Arrival date & time: 06/19/22  1508     History  Chief Complaint  Patient presents with   Hypertension   Dizziness   Palpitations   Chest Pain    Garrett Wilkinson is a 62 y.o. male.  62 year old male presents with sudden onset of dizziness with some left arm discomfort.  Patient states it felt like heartburn.  He denies any chest pain or chest pressure.  He was not short of breath or diaphoretic.  Symptoms lasted for between 2 and 20 minutes.  Have since resolved.  They resolved spontaneously no prior history of same.  No history of headache with this episode.  Denies any focal weakness to his arms or legs.  No confusion noted.  Denies any recent illnesses.  Family history of CAD.  Called EMS and was found to be hypertensive tachycardic and was transported here.       Home Medications Prior to Admission medications   Medication Sig Start Date End Date Taking? Authorizing Provider  Multiple Vitamin (MULTIVITAMINS PO) Take 1 tablet by mouth daily.     [provider]  RIVAROXABAN Alveda Reasons) VTE STARTER PACK (15 & 20 MG TABLETS) Follow package directions: Take one 15mg  tablet by mouth twice a day. On day 22, switch to one 20mg  tablet once a day. Take with food. 11/08/19   Long, [facility], MD      Allergies    Patient has no known allergies.    Review of Systems   Review of Systems  All other systems reviewed and are negative.   Physical Exam Updated Vital Signs BP (!) 153/102 (BP Location: Right Arm)   Pulse (!) 101   Temp 98.4 F (36.9 C) (Oral)   Resp 14   Ht 1.892 m (6' 2.5")   Wt 90.7 kg   SpO2 100%   BMI 25.33 kg/m  Physical Exam Vitals and nursing note reviewed.  Constitutional:      General: He is not in acute distress.    Appearance: Normal appearance. He is well-developed. He is not toxic-appearing.  HENT:     Head: Normocephalic and atraumatic.  Eyes:      General: Lids are normal.     Conjunctiva/sclera: Conjunctivae normal.     Pupils: Pupils are equal, round, and reactive to light.  Neck:     Thyroid: No thyroid mass.     Trachea: No tracheal deviation.  Cardiovascular:     Rate and Rhythm: Regular rhythm. Tachycardia present.     Heart sounds: Normal heart sounds. No murmur heard.    No gallop.  Pulmonary:     Effort: Pulmonary effort is normal. No respiratory distress.     Breath sounds: Normal breath sounds. No stridor. No decreased breath sounds, wheezing, rhonchi or rales.  Abdominal:     General: There is no distension.     Palpations: Abdomen is soft.     Tenderness: There is no abdominal tenderness. There is no rebound.  Musculoskeletal:        General: No tenderness. Normal range of motion.     Cervical back: Normal range of motion and neck supple.  Skin:    General: Skin is warm and dry.     Findings: No abrasion or rash.  Neurological:     Mental Status: He is alert and oriented to person, place, and time. Mental status is at baseline.     GCS:  GCS eye subscore is 4. GCS verbal subscore is 5. GCS motor subscore is 6.     Cranial Nerves: No cranial nerve deficit.     Sensory: No sensory deficit.     Motor: Motor function is intact.  Psychiatric:        Attention and Perception: Attention normal.        Speech: Speech normal.        Behavior: Behavior normal.     ED Results / Procedures / Treatments   Labs (all labs ordered are listed, but only abnormal results are displayed) Labs Reviewed  CBC  COMPREHENSIVE METABOLIC PANEL  TSH  TROPONIN I (HIGH SENSITIVITY)    EKG EKG Interpretation  Date/Time:  Friday June 19 2022 15:15:14 EDT Ventricular Rate:  116 PR Interval:  158 QRS Duration: 76 QT Interval:  310 QTC Calculation: 430 R Axis:   77 Text Interpretation: Sinus tachycardia with occasional Premature ventricular complexes Otherwise normal ECG No previous ECGs available Confirmed by Lacretia Leigh  ([id number]) on 06/19/2022 4:18:11 PM  Radiology DG Chest 2 View  Result Date: 06/19/2022 CLINICAL DATA:  Chest pain. EXAM: CHEST - 2 VIEW COMPARISON:  None Available. FINDINGS: The heart size and mediastinal contours are within normal limits. Both lungs are clear. The visualized skeletal structures are unremarkable. IMPRESSION: No active cardiopulmonary disease. Electronically Signed   By: Marijo Conception M.D.   On: 06/19/2022 16:29    Procedures Procedures    Medications Ordered in ED Medications - No data to display  ED Course/ Medical Decision Making/ A&P                             Medical Decision Making Amount and/or Complexity of Data Reviewed Labs: ordered. Radiology: ordered.  Patient is EKG per interpretation shows sinus tachycardia.  Patient's chest x-ray per interpretation shows no acute findings. Patient with troponins x 2 that are negative.  Low suspicion for ACS.  D-dimer negative to.  Low suspicion for PE.  Unclear etiology of dizziness today.  Low suspicion as above for any serious pathology at this time.  Do not think this was a neurological event.  Return precautions given.        Final Clinical Impression(s) / ED Diagnoses Final diagnoses:  None    Rx / DC Orders ED Discharge Orders     None         Lacretia Leigh, MD 06/19/22 2026

## 2022-06-19 NOTE — ED Notes (Signed)
Pt arrives to room 29

## 2022-06-19 NOTE — ED Triage Notes (Signed)
Pt arrived to ED via EMS from work. Pt had 1100 onset of what he felt to be heartburn and numbness to L arm, dizzy and palpitations. Pt doesn't have a hx of HTN, but EMS found pt to be HTN at 210/110 and tachy at 148. Last VS BP 168/110, HR 115. Pt took 325mg  ASA this morning. 18g LAC

## 2022-06-19 NOTE — ED Notes (Signed)
Pt left prior to AVS.  This RN provided education prior; all questions answered.  Pt leaving ED in stable condition at this time, ambulatory with all belongings.

## 2022-06-19 NOTE — ED Notes (Signed)
Provider at bedside at this time, updating pt on discharge plan.

## 2022-06-19 NOTE — ED Notes (Signed)
Pt requesting IV to be removed.  States he may have to leave before official discharge.
